# Patient Record
Sex: Male | Born: 1955 | Race: White | Hispanic: No | Marital: Single | Smoking: Never smoker
Health system: Southern US, Community
[De-identification: ages and names within clinical notes are randomized; demographics above are authoritative.]

## PROBLEM LIST (undated history)

## (undated) DIAGNOSIS — I10 Essential (primary) hypertension: Secondary | ICD-10-CM

## (undated) DIAGNOSIS — J45909 Unspecified asthma, uncomplicated: Secondary | ICD-10-CM

## (undated) DIAGNOSIS — M549 Dorsalgia, unspecified: Secondary | ICD-10-CM

## (undated) DIAGNOSIS — M1990 Unspecified osteoarthritis, unspecified site: Secondary | ICD-10-CM

## (undated) HISTORY — PX: HERNIA REPAIR: SHX51

## (undated) HISTORY — PX: COLONOSCOPY: SHX174

---

## 2006-07-24 ENCOUNTER — Emergency Department: Payer: Self-pay | Admitting: Emergency Medicine

## 2006-07-24 ENCOUNTER — Other Ambulatory Visit: Payer: Self-pay

## 2013-02-12 ENCOUNTER — Ambulatory Visit: Payer: Self-pay

## 2013-03-05 ENCOUNTER — Ambulatory Visit: Payer: Self-pay

## 2013-03-23 ENCOUNTER — Ambulatory Visit: Payer: Self-pay

## 2013-08-24 DIAGNOSIS — M48061 Spinal stenosis, lumbar region without neurogenic claudication: Secondary | ICD-10-CM | POA: Insufficient documentation

## 2013-08-24 DIAGNOSIS — M545 Low back pain, unspecified: Secondary | ICD-10-CM | POA: Insufficient documentation

## 2013-09-11 HISTORY — PX: BACK SURGERY: SHX140

## 2014-02-08 ENCOUNTER — Ambulatory Visit: Payer: Self-pay

## 2014-06-22 ENCOUNTER — Other Ambulatory Visit: Payer: Self-pay | Admitting: Neurosurgery

## 2014-06-22 DIAGNOSIS — M5416 Radiculopathy, lumbar region: Secondary | ICD-10-CM

## 2014-07-04 ENCOUNTER — Ambulatory Visit
Admission: RE | Admit: 2014-07-04 | Discharge: 2014-07-04 | Disposition: A | Discharge status: Home / Self Care | Payer: BC Managed Care – PPO | Source: Ambulatory Visit | Attending: Neurosurgery | Admitting: Neurosurgery

## 2014-07-04 VITALS — BP 136/75 | HR 70

## 2014-07-04 DIAGNOSIS — F419 Anxiety disorder, unspecified: Secondary | ICD-10-CM | POA: Insufficient documentation

## 2014-07-04 DIAGNOSIS — M5416 Radiculopathy, lumbar region: Secondary | ICD-10-CM

## 2014-07-04 DIAGNOSIS — I10 Essential (primary) hypertension: Secondary | ICD-10-CM | POA: Insufficient documentation

## 2014-07-04 DIAGNOSIS — E782 Mixed hyperlipidemia: Secondary | ICD-10-CM | POA: Insufficient documentation

## 2014-07-04 MED ORDER — DIAZEPAM 5 MG PO TABS
10.0000 mg | ORAL_TABLET | Freq: Once | ORAL | Status: AC
  Administered 2014-07-04: 10 mg via ORAL

## 2014-07-04 MED ORDER — IOHEXOL 180 MG/ML  SOLN
15.0000 mL | Freq: Once | INTRAMUSCULAR | Status: AC | PRN
  Administered 2014-07-04: 15 mL via INTRATHECAL

## 2014-07-04 NOTE — Discharge Instructions (Signed)
Myelogram Discharge Instructions  1. Go home and rest quietly for the next 24 hours.  It is important to lie flat for the next 24 hours.  Get up only to go to the restroom.  You may lie in the bed or on a couch on your back, your stomach, your left side or your right side.  You may have one pillow under your head.  You may have pillows between your knees while you are on your side or under your knees while you are on your back.  2. DO NOT drive today.  Recline the seat as far back as it will go, while still wearing your seat belt, on the way home.  3. You may get up to go to the bathroom as needed.  You may sit up for 10 minutes to eat.  You may resume your normal diet and medications unless otherwise indicated.  Drink lots of extra fluids today and tomorrow.  4. The incidence of headache, nausea, or vomiting is about 5% (one in 20 patients).  If you develop a headache, lie flat and drink plenty of fluids until the headache goes away.  Caffeinated beverages may be helpful.  If you develop severe nausea and vomiting or a headache that does not go away with flat bed rest, call [phone].  5. You may resume normal activities after your 24 hours of bed rest is over; however, do not exert yourself strongly or do any heavy lifting tomorrow. If when you get up you have a headache when standing, go back to bed and force fluids for another 24 hours.  6. Call your physician for a follow-up appointment.  The results of your myelogram will be sent directly to your physician by the following day.  7. If you have any questions or if complications develop after you arrive home, please call [phone].  Discharge instructions have been explained to the patient.  The patient, or the person responsible for the patient, fully understands these instructions.      May resume Duloxetine on Aug. 25, 2015, after 1:00 pm.

## 2014-07-04 NOTE — Progress Notes (Signed)
Patient states his last dose of duloxetine was two mornings ago.  jkl

## 2014-08-01 ENCOUNTER — Other Ambulatory Visit: Payer: Self-pay | Admitting: Neurosurgery

## 2014-08-09 ENCOUNTER — Encounter (HOSPITAL_COMMUNITY): Payer: Self-pay

## 2014-08-09 ENCOUNTER — Ambulatory Visit (HOSPITAL_COMMUNITY)
Admission: RE | Admit: 2014-08-09 | Discharge: 2014-08-09 | Disposition: A | Discharge status: Home / Self Care | Payer: BC Managed Care – PPO | Source: Ambulatory Visit | Attending: Neurosurgery | Admitting: Neurosurgery

## 2014-08-09 ENCOUNTER — Encounter (HOSPITAL_COMMUNITY)
Admission: RE | Admit: 2014-08-09 | Discharge: 2014-08-09 | Disposition: A | Discharge status: Home / Self Care | Payer: BC Managed Care – PPO | Source: Ambulatory Visit | Attending: Neurosurgery | Admitting: Neurosurgery

## 2014-08-09 ENCOUNTER — Ambulatory Visit (HOSPITAL_COMMUNITY): Admission: RE | Admit: 2014-08-09 | Payer: BC Managed Care – PPO | Source: Ambulatory Visit

## 2014-08-09 DIAGNOSIS — Z01818 Encounter for other preprocedural examination: Secondary | ICD-10-CM | POA: Diagnosis not present

## 2014-08-09 DIAGNOSIS — I10 Essential (primary) hypertension: Secondary | ICD-10-CM | POA: Insufficient documentation

## 2014-08-09 HISTORY — DX: Unspecified asthma, uncomplicated: J45.909

## 2014-08-09 HISTORY — DX: Dorsalgia, unspecified: M54.9

## 2014-08-09 HISTORY — DX: Essential (primary) hypertension: I10

## 2014-08-09 HISTORY — DX: Unspecified osteoarthritis, unspecified site: M19.90

## 2014-08-09 LAB — BASIC METABOLIC PANEL
Anion gap: 13 (ref 5–15)
BUN: 16 mg/dL (ref 6–23)
CHLORIDE: 100 meq/L (ref 96–112)
CO2: 25 mEq/L (ref 19–32)
Calcium: 9.4 mg/dL (ref 8.4–10.5)
Creatinine, Ser: 0.88 mg/dL (ref 0.50–1.35)
GFR calc Af Amer: >90 mL/min (ref >90)
GLUCOSE: 105 mg/dL — AB (ref 70–99)
POTASSIUM: 4.5 meq/L (ref 3.7–5.3)
SODIUM: 138 meq/L (ref 137–147)

## 2014-08-09 LAB — CBC
HCT: 49 % (ref 39.0–52.0)
HEMOGLOBIN: 17.3 g/dL — AB (ref 13.0–17.0)
MCH: 32 pg (ref 26.0–34.0)
MCHC: 35.3 g/dL (ref 30.0–36.0)
MCV: 90.6 fL (ref 78.0–100.0)
Platelets: 216 10*3/uL (ref 150–400)
RBC: 5.41 MIL/uL (ref 4.22–5.81)
RDW: 13.5 % (ref 11.5–15.5)
WBC: 11.5 10*3/uL — AB (ref 4.0–10.5)

## 2014-08-09 LAB — SURGICAL PCR SCREEN
MRSA, PCR: NEGATIVE
STAPHYLOCOCCUS AUREUS: NEGATIVE

## 2014-08-09 LAB — ABO/RH: ABO/RH(D): A POS

## 2014-08-09 LAB — TYPE AND SCREEN
ABO/RH(D): A POS
ANTIBODY SCREEN: NEGATIVE

## 2014-08-09 NOTE — Progress Notes (Signed)
Patient informed Nurse that he had [facility] within [facility]. Release signed. Will request records. Patient denied having a cardiac cath or sleep study. Patient also denied having any acute cardiac or pulmonary issues.

## 2014-08-09 NOTE — Pre-Procedure Instructions (Signed)
Christopher Hester  08/09/2014   Your procedure is scheduled on:  Wednesday August 17, 2014 at 8:30 AM.  [facility] 6:30 AM.  Call this number if you have problems the morning of surgery: [phone]   Remember:   Do not eat food or drink liquids after midnight.   Take these medicines the morning of surgery with A SIP OF WATER: Acetaminophen (Tylenol), Albuterol inhaler, Qvar inhaler,and Oxycodone   Do not wear jewelry.  Do not wear lotions, powders, or cologne.   Men may shave face and neck.  Do not bring valuables to the hospital.  [facility] is not responsible for any belongings or valuables.               Contacts, dentures or bridgework may not be worn into surgery.  Leave suitcase in the car. After surgery it may be brought to your room.  For patients admitted to the hospital, discharge time is determined by your treatment team.               Patients discharged the day of surgery will not be allowed to drive home.  Name and phone number of your driver: Family/Friend  Special Instructions: Shower using CHG soap the night before and the morning of your surgery   Please read over the following fact sheets that you were given: Pain Booklet, Coughing and Deep Breathing, Blood Transfusion Information, MRSA Information and Surgical Site Infection Prevention

## 2014-08-16 MED ORDER — CEFAZOLIN SODIUM-DEXTROSE 2-3 GM-% IV SOLR
2.0000 g | INTRAVENOUS | Status: AC
  Administered 2014-08-17 (×3): 2 g via INTRAVENOUS
  Filled 2014-08-16: qty 50

## 2014-08-17 ENCOUNTER — Inpatient Hospital Stay (HOSPITAL_COMMUNITY): Payer: BC Managed Care – PPO

## 2014-08-17 ENCOUNTER — Inpatient Hospital Stay (HOSPITAL_COMMUNITY): Payer: BC Managed Care – PPO | Admitting: Certified Registered Nurse Anesthetist

## 2014-08-17 ENCOUNTER — Encounter (HOSPITAL_COMMUNITY): Payer: BC Managed Care – PPO | Admitting: Certified Registered Nurse Anesthetist

## 2014-08-17 ENCOUNTER — Encounter (HOSPITAL_COMMUNITY): Payer: Self-pay | Admitting: *Deleted

## 2014-08-17 ENCOUNTER — Inpatient Hospital Stay (HOSPITAL_COMMUNITY)
Admission: RE | Admit: 2014-08-17 | Discharge: 2014-08-23 | DRG: 460 | Disposition: A | Discharge status: Skilled Nursing Facility | Payer: BC Managed Care – PPO | Source: Ambulatory Visit | Attending: Neurosurgery | Admitting: Neurosurgery

## 2014-08-17 ENCOUNTER — Encounter (HOSPITAL_COMMUNITY)
Admission: RE | Disposition: A | Discharge status: Skilled Nursing Facility | Payer: BC Managed Care – PPO | Source: Ambulatory Visit | Attending: Neurosurgery

## 2014-08-17 DIAGNOSIS — M129 Arthropathy, unspecified: Secondary | ICD-10-CM | POA: Diagnosis present

## 2014-08-17 DIAGNOSIS — M5489 Other dorsalgia: Secondary | ICD-10-CM | POA: Diagnosis present

## 2014-08-17 DIAGNOSIS — M5137 Other intervertebral disc degeneration, lumbosacral region: Secondary | ICD-10-CM | POA: Diagnosis present

## 2014-08-17 DIAGNOSIS — G8929 Other chronic pain: Secondary | ICD-10-CM | POA: Diagnosis present

## 2014-08-17 DIAGNOSIS — M4806 Spinal stenosis, lumbar region: Secondary | ICD-10-CM | POA: Diagnosis present

## 2014-08-17 DIAGNOSIS — M5416 Radiculopathy, lumbar region: Secondary | ICD-10-CM | POA: Diagnosis present

## 2014-08-17 DIAGNOSIS — R339 Retention of urine, unspecified: Secondary | ICD-10-CM | POA: Diagnosis present

## 2014-08-17 DIAGNOSIS — M4316 Spondylolisthesis, lumbar region: Secondary | ICD-10-CM

## 2014-08-17 DIAGNOSIS — M1990 Unspecified osteoarthritis, unspecified site: Secondary | ICD-10-CM | POA: Diagnosis present

## 2014-08-17 DIAGNOSIS — M48062 Spinal stenosis, lumbar region with neurogenic claudication: Secondary | ICD-10-CM | POA: Diagnosis present

## 2014-08-17 DIAGNOSIS — I10 Essential (primary) hypertension: Secondary | ICD-10-CM | POA: Diagnosis present

## 2014-08-17 DIAGNOSIS — J45909 Unspecified asthma, uncomplicated: Secondary | ICD-10-CM | POA: Diagnosis present

## 2014-08-17 HISTORY — PX: POSTERIOR LUMBAR FUSION 4 LEVEL: SHX6037

## 2014-08-17 SURGERY — POSTERIOR LUMBAR FUSION 4 LEVEL
Anesthesia: General | Site: Back

## 2014-08-17 MED ORDER — ALUM & MAG HYDROXIDE-SIMETH 200-200-20 MG/5ML PO SUSP: 30.0000 mL | Freq: Four times a day (QID) | ORAL | Status: DC | PRN

## 2014-08-17 MED ORDER — HYDROMORPHONE HCL 1 MG/ML IJ SOLN
INTRAMUSCULAR | Status: AC
  Filled 2014-08-17: qty 1

## 2014-08-17 MED ORDER — LACTATED RINGERS IV SOLN
INTRAVENOUS | Status: DC
  Administered 2014-08-17 – 2014-08-19 (×4): via INTRAVENOUS

## 2014-08-17 MED ORDER — SODIUM CHLORIDE 0.9 % IJ SOLN
INTRAMUSCULAR | Status: AC
  Filled 2014-08-17: qty 10

## 2014-08-17 MED ORDER — FENTANYL CITRATE 0.05 MG/ML IJ SOLN
INTRAMUSCULAR | Status: AC
  Filled 2014-08-17: qty 5

## 2014-08-17 MED ORDER — ACETAMINOPHEN 650 MG RE SUPP: 650.0000 mg | RECTAL | Status: DC | PRN

## 2014-08-17 MED ORDER — ACETAMINOPHEN 325 MG PO TABS
650.0000 mg | ORAL_TABLET | ORAL | Status: DC | PRN
  Administered 2014-08-18 – 2014-08-21 (×3): 650 mg via ORAL
  Filled 2014-08-17 (×3): qty 2

## 2014-08-17 MED ORDER — FENTANYL CITRATE 0.05 MG/ML IJ SOLN
INTRAMUSCULAR | Status: DC | PRN
  Administered 2014-08-17 (×9): 50 ug via INTRAVENOUS
  Administered 2014-08-17: 100 ug via INTRAVENOUS
  Administered 2014-08-17 (×2): 50 ug via INTRAVENOUS

## 2014-08-17 MED ORDER — SCOPOLAMINE 1 MG/3DAYS TD PT72
MEDICATED_PATCH | TRANSDERMAL | Status: AC
  Filled 2014-08-17: qty 1

## 2014-08-17 MED ORDER — HYDROCODONE-ACETAMINOPHEN 5-325 MG PO TABS: 1.0000 | ORAL_TABLET | ORAL | Status: DC | PRN

## 2014-08-17 MED ORDER — BUPIVACAINE-EPINEPHRINE (PF) 0.5% -1:200000 IJ SOLN
INTRAMUSCULAR | Status: DC | PRN
  Administered 2014-08-17: 20 mL

## 2014-08-17 MED ORDER — CEFAZOLIN SODIUM-DEXTROSE 2-3 GM-% IV SOLR
2.0000 g | Freq: Three times a day (TID) | INTRAVENOUS | Status: AC
  Administered 2014-08-17 – 2014-08-18 (×2): 2 g via INTRAVENOUS
  Filled 2014-08-17 (×2): qty 50

## 2014-08-17 MED ORDER — SCOPOLAMINE 1 MG/3DAYS TD PT72
1.0000 | MEDICATED_PATCH | TRANSDERMAL | Status: DC
  Administered 2014-08-17: 1 via TRANSDERMAL

## 2014-08-17 MED ORDER — SODIUM CHLORIDE 0.9 % IV SOLN
INTRAVENOUS | Status: DC | PRN
  Administered 2014-08-17: 14:00:00 via INTRAVENOUS

## 2014-08-17 MED ORDER — OXYCODONE HCL 5 MG PO TABS: 5.0000 mg | ORAL_TABLET | Freq: Once | ORAL | Status: DC | PRN

## 2014-08-17 MED ORDER — PHENYLEPHRINE HCL 10 MG/ML IJ SOLN
INTRAMUSCULAR | Status: DC | PRN
  Administered 2014-08-17 (×2): 80 ug via INTRAVENOUS

## 2014-08-17 MED ORDER — DEXAMETHASONE SODIUM PHOSPHATE 4 MG/ML IJ SOLN
INTRAMUSCULAR | Status: AC
  Filled 2014-08-17: qty 1

## 2014-08-17 MED ORDER — FLUTICASONE PROPIONATE HFA 44 MCG/ACT IN AERO
1.0000 | INHALATION_SPRAY | Freq: Two times a day (BID) | RESPIRATORY_TRACT | Status: DC
  Administered 2014-08-19 – 2014-08-23 (×8): 1 via RESPIRATORY_TRACT
  Filled 2014-08-17 (×2): qty 10.6

## 2014-08-17 MED ORDER — DOCUSATE SODIUM 100 MG PO CAPS
100.0000 mg | ORAL_CAPSULE | Freq: Two times a day (BID) | ORAL | Status: DC
  Administered 2014-08-17 – 2014-08-23 (×12): 100 mg via ORAL
  Filled 2014-08-17 (×12): qty 1

## 2014-08-17 MED ORDER — ONDANSETRON HCL 4 MG/2ML IJ SOLN
4.0000 mg | INTRAMUSCULAR | Status: DC | PRN
  Filled 2014-08-17: qty 2

## 2014-08-17 MED ORDER — MIDAZOLAM HCL 2 MG/2ML IJ SOLN
INTRAMUSCULAR | Status: AC
  Filled 2014-08-17: qty 2

## 2014-08-17 MED ORDER — PROPOFOL 10 MG/ML IV BOLUS
INTRAVENOUS | Status: AC
  Filled 2014-08-17: qty 20

## 2014-08-17 MED ORDER — ALBUTEROL SULFATE (2.5 MG/3ML) 0.083% IN NEBU: 2.5000 mL | INHALATION_SOLUTION | Freq: Four times a day (QID) | RESPIRATORY_TRACT | Status: DC | PRN

## 2014-08-17 MED ORDER — NEOSTIGMINE METHYLSULFATE 10 MG/10ML IV SOLN
INTRAVENOUS | Status: DC | PRN
  Administered 2014-08-17: 4 mg via INTRAVENOUS

## 2014-08-17 MED ORDER — LACTATED RINGERS IV SOLN
INTRAVENOUS | Status: DC | PRN
  Administered 2014-08-17 (×3): via INTRAVENOUS

## 2014-08-17 MED ORDER — PROMETHAZINE HCL 25 MG/ML IJ SOLN: 6.2500 mg | INTRAMUSCULAR | Status: DC | PRN

## 2014-08-17 MED ORDER — DIAZEPAM 5 MG PO TABS
5.0000 mg | ORAL_TABLET | Freq: Four times a day (QID) | ORAL | Status: DC | PRN
  Administered 2014-08-18 – 2014-08-23 (×11): 5 mg via ORAL
  Filled 2014-08-17 (×12): qty 1

## 2014-08-17 MED ORDER — DEXAMETHASONE SODIUM PHOSPHATE 4 MG/ML IJ SOLN
INTRAMUSCULAR | Status: DC | PRN
  Administered 2014-08-17: 4 mg via INTRAVENOUS

## 2014-08-17 MED ORDER — MIDAZOLAM HCL 5 MG/5ML IJ SOLN
INTRAMUSCULAR | Status: DC | PRN
  Administered 2014-08-17 (×2): 2 mg via INTRAVENOUS

## 2014-08-17 MED ORDER — NEOSTIGMINE METHYLSULFATE 10 MG/10ML IV SOLN
INTRAVENOUS | Status: AC
  Filled 2014-08-17: qty 1

## 2014-08-17 MED ORDER — MENTHOL 3 MG MT LOZG
1.0000 | LOZENGE | OROMUCOSAL | Status: DC | PRN
Start: 2014-08-17 — End: 2014-08-23

## 2014-08-17 MED ORDER — LIDOCAINE HCL (CARDIAC) 20 MG/ML IV SOLN
INTRAVENOUS | Status: AC
  Filled 2014-08-17: qty 5

## 2014-08-17 MED ORDER — LIDOCAINE HCL (CARDIAC) 20 MG/ML IV SOLN
INTRAVENOUS | Status: DC | PRN
  Administered 2014-08-17: 100 mg via INTRAVENOUS

## 2014-08-17 MED ORDER — EPHEDRINE SULFATE 50 MG/ML IJ SOLN
INTRAMUSCULAR | Status: AC
  Filled 2014-08-17: qty 1

## 2014-08-17 MED ORDER — PHENYLEPHRINE HCL 10 MG/ML IJ SOLN
10.0000 mg | INTRAVENOUS | Status: DC | PRN
  Administered 2014-08-17: 15 ug/min via INTRAVENOUS

## 2014-08-17 MED ORDER — ONDANSETRON HCL 4 MG/2ML IJ SOLN
INTRAMUSCULAR | Status: AC
  Filled 2014-08-17: qty 2

## 2014-08-17 MED ORDER — ROCURONIUM BROMIDE 50 MG/5ML IV SOLN
INTRAVENOUS | Status: AC
  Filled 2014-08-17: qty 2

## 2014-08-17 MED ORDER — 0.9 % SODIUM CHLORIDE (POUR BTL) OPTIME
TOPICAL | Status: DC | PRN
  Administered 2014-08-17 (×2): 1000 mL

## 2014-08-17 MED ORDER — BUPIVACAINE LIPOSOME 1.3 % IJ SUSP
20.0000 mL | INTRAMUSCULAR | Status: DC
  Filled 2014-08-17: qty 20

## 2014-08-17 MED ORDER — PHENYLEPHRINE 40 MCG/ML (10ML) SYRINGE FOR IV PUSH (FOR BLOOD PRESSURE SUPPORT)
PREFILLED_SYRINGE | INTRAVENOUS | Status: AC
  Filled 2014-08-17: qty 10

## 2014-08-17 MED ORDER — BUPIVACAINE LIPOSOME 1.3 % IJ SUSP
INTRAMUSCULAR | Status: DC | PRN
  Administered 2014-08-17: 20 mL

## 2014-08-17 MED ORDER — SODIUM CHLORIDE 0.9 % IR SOLN
Status: DC | PRN
  Administered 2014-08-17 (×2)

## 2014-08-17 MED ORDER — HYDROMORPHONE HCL 1 MG/ML IJ SOLN
0.2500 mg | INTRAMUSCULAR | Status: DC | PRN
  Administered 2014-08-17 (×4): 0.5 mg via INTRAVENOUS

## 2014-08-17 MED ORDER — PHENOL 1.4 % MT LIQD
1.0000 | OROMUCOSAL | Status: DC | PRN
Start: 2014-08-17 — End: 2014-08-23

## 2014-08-17 MED ORDER — SUCCINYLCHOLINE CHLORIDE 20 MG/ML IJ SOLN
INTRAMUSCULAR | Status: DC | PRN
  Administered 2014-08-17: 110 mg via INTRAVENOUS

## 2014-08-17 MED ORDER — BACITRACIN ZINC 500 UNIT/GM EX OINT
TOPICAL_OINTMENT | CUTANEOUS | Status: DC | PRN
  Administered 2014-08-17: 1 via TOPICAL

## 2014-08-17 MED ORDER — ROCURONIUM BROMIDE 50 MG/5ML IV SOLN
INTRAVENOUS | Status: AC
  Filled 2014-08-17: qty 1

## 2014-08-17 MED ORDER — GLYCOPYRROLATE 0.2 MG/ML IJ SOLN
INTRAMUSCULAR | Status: DC | PRN
  Administered 2014-08-17: 0.6 mg via INTRAVENOUS

## 2014-08-17 MED ORDER — ROCURONIUM BROMIDE 100 MG/10ML IV SOLN
INTRAVENOUS | Status: DC | PRN
  Administered 2014-08-17 (×3): 10 mg via INTRAVENOUS
  Administered 2014-08-17: 50 mg via INTRAVENOUS
  Administered 2014-08-17 (×9): 10 mg via INTRAVENOUS

## 2014-08-17 MED ORDER — OXYCODONE HCL 5 MG/5ML PO SOLN: 5.0000 mg | Freq: Once | ORAL | Status: DC | PRN

## 2014-08-17 MED ORDER — GLYCOPYRROLATE 0.2 MG/ML IJ SOLN
INTRAMUSCULAR | Status: AC
  Filled 2014-08-17: qty 3

## 2014-08-17 MED ORDER — CEFAZOLIN SODIUM-DEXTROSE 2-3 GM-% IV SOLR
INTRAVENOUS | Status: AC
  Filled 2014-08-17: qty 50

## 2014-08-17 MED ORDER — PROPOFOL 10 MG/ML IV BOLUS
INTRAVENOUS | Status: DC | PRN
  Administered 2014-08-17: 170 mg via INTRAVENOUS

## 2014-08-17 MED ORDER — THROMBIN 20000 UNITS EX SOLR
CUTANEOUS | Status: DC | PRN
  Administered 2014-08-17 (×2): via TOPICAL

## 2014-08-17 MED ORDER — DULOXETINE HCL 60 MG PO CPEP
60.0000 mg | ORAL_CAPSULE | Freq: Every day | ORAL | Status: DC
  Administered 2014-08-17 – 2014-08-23 (×7): 60 mg via ORAL
  Filled 2014-08-17 (×7): qty 1

## 2014-08-17 MED ORDER — ONDANSETRON HCL 4 MG/2ML IJ SOLN
INTRAMUSCULAR | Status: DC | PRN
  Administered 2014-08-17: 4 mg via INTRAVENOUS

## 2014-08-17 MED ORDER — ALBUMIN HUMAN 5 % IV SOLN
INTRAVENOUS | Status: DC | PRN
  Administered 2014-08-17: 16:00:00 via INTRAVENOUS

## 2014-08-17 MED ORDER — AMLODIPINE BESYLATE 2.5 MG PO TABS
2.5000 mg | ORAL_TABLET | Freq: Every day | ORAL | Status: DC
  Administered 2014-08-17 – 2014-08-23 (×6): 2.5 mg via ORAL
  Filled 2014-08-17 (×8): qty 1

## 2014-08-17 MED ORDER — MORPHINE SULFATE 2 MG/ML IJ SOLN
1.0000 mg | INTRAMUSCULAR | Status: DC | PRN
  Administered 2014-08-17 – 2014-08-18 (×7): 4 mg via INTRAVENOUS
  Administered 2014-08-19 (×3): 2 mg via INTRAVENOUS
  Administered 2014-08-19 (×2): 4 mg via INTRAVENOUS
  Administered 2014-08-20 – 2014-08-22 (×11): 2 mg via INTRAVENOUS
  Filled 2014-08-17 (×2): qty 1
  Filled 2014-08-17 (×2): qty 2
  Filled 2014-08-17: qty 1
  Filled 2014-08-17: qty 2
  Filled 2014-08-17 (×3): qty 1
  Filled 2014-08-17: qty 2
  Filled 2014-08-17 (×2): qty 1
  Filled 2014-08-17 (×2): qty 2
  Filled 2014-08-17 (×3): qty 1
  Filled 2014-08-17 (×2): qty 2
  Filled 2014-08-17 (×2): qty 1
  Filled 2014-08-17: qty 2
  Filled 2014-08-17 (×2): qty 1

## 2014-08-17 MED ORDER — OXYCODONE-ACETAMINOPHEN 5-325 MG PO TABS
1.0000 | ORAL_TABLET | ORAL | Status: DC | PRN
  Administered 2014-08-17 – 2014-08-23 (×20): 2 via ORAL
  Filled 2014-08-17 (×22): qty 2

## 2014-08-17 MED ORDER — LACTATED RINGERS IV SOLN
INTRAVENOUS | Status: DC | PRN
  Administered 2014-08-17 (×2): via INTRAVENOUS

## 2014-08-17 SURGICAL SUPPLY — 78 items
BAG DECANTER FOR FLEXI CONT (MISCELLANEOUS) ×3 IMPLANT
BENZOIN TINCTURE PRP APPL 2/3 (GAUZE/BANDAGES/DRESSINGS) ×3 IMPLANT
BLADE CLIPPER SURG (BLADE) ×3 IMPLANT
BRUSH SCRUB EZ PLAIN DRY (MISCELLANEOUS) ×3 IMPLANT
BUR MATCHSTICK NEURO 3.0 LAGG (BURR) ×3 IMPLANT
BUR PRECISION FLUTE 6.0 (BURR) ×3 IMPLANT
CANISTER SUCT 3000ML (MISCELLANEOUS) ×3 IMPLANT
CAP REVERE LOCKING (Cap) ×30 IMPLANT
CLOSURE WOUND 1/2 X4 (GAUZE/BANDAGES/DRESSINGS) ×1
CONN CROSSLINK REV 6.35 48-60 (Connector) ×3 IMPLANT
CONNECTOR CRSLNK REV6.35 48-60 (Connector) ×1 IMPLANT
CONT SPEC 4OZ CLIKSEAL STRL BL (MISCELLANEOUS) ×6 IMPLANT
COVER BACK TABLE 24X17X13 BIG (DRAPES) IMPLANT
DRAPE C-ARM 42X72 X-RAY (DRAPES) ×6 IMPLANT
DRAPE LAPAROTOMY 100X72X124 (DRAPES) ×3 IMPLANT
DRAPE POUCH INSTRU U-SHP 10X18 (DRAPES) ×3 IMPLANT
DRAPE PROXIMA HALF (DRAPES) ×3 IMPLANT
DRAPE SURG 17X23 STRL (DRAPES) ×12 IMPLANT
ELECT BLADE 4.0 EZ CLEAN MEGAD (MISCELLANEOUS) ×3
ELECT REM PT RETURN 9FT ADLT (ELECTROSURGICAL) ×3
ELECTRODE BLDE 4.0 EZ CLN MEGD (MISCELLANEOUS) ×1 IMPLANT
ELECTRODE REM PT RTRN 9FT ADLT (ELECTROSURGICAL) ×1 IMPLANT
EVACUATOR 1/8 PVC DRAIN (DRAIN) ×3 IMPLANT
GAUZE SPONGE 4X4 12PLY STRL (GAUZE/BANDAGES/DRESSINGS) ×3 IMPLANT
GAUZE SPONGE 4X4 16PLY XRAY LF (GAUZE/BANDAGES/DRESSINGS) ×6 IMPLANT
GLOVE BIO SURGEON STRL SZ8.5 (GLOVE) ×6 IMPLANT
GLOVE BIOGEL PI IND STRL 8 (GLOVE) ×5 IMPLANT
GLOVE BIOGEL PI INDICATOR 8 (GLOVE) ×10
GLOVE ECLIPSE 7.5 STRL STRAW (GLOVE) ×21 IMPLANT
GLOVE EXAM NITRILE LRG STRL (GLOVE) IMPLANT
GLOVE EXAM NITRILE MD LF STRL (GLOVE) IMPLANT
GLOVE EXAM NITRILE XL STR (GLOVE) IMPLANT
GLOVE EXAM NITRILE XS STR PU (GLOVE) IMPLANT
GLOVE SS BIOGEL STRL SZ 8 (GLOVE) ×2 IMPLANT
GLOVE SUPERSENSE BIOGEL SZ 8 (GLOVE) ×4
GOWN STRL REUS W/ TWL LRG LVL3 (GOWN DISPOSABLE) IMPLANT
GOWN STRL REUS W/ TWL XL LVL3 (GOWN DISPOSABLE) ×3 IMPLANT
GOWN STRL REUS W/TWL 2XL LVL3 (GOWN DISPOSABLE) ×9 IMPLANT
GOWN STRL REUS W/TWL LRG LVL3 (GOWN DISPOSABLE)
GOWN STRL REUS W/TWL XL LVL3 (GOWN DISPOSABLE) ×6
KIT BASIN OR (CUSTOM PROCEDURE TRAY) ×3 IMPLANT
KIT INFUSE MEDIUM (Orthopedic Implant) ×3 IMPLANT
KIT ROOM TURNOVER OR (KITS) ×3 IMPLANT
MILL MEDIUM DISP (BLADE) ×3 IMPLANT
NEEDLE HYPO 21X1.5 SAFETY (NEEDLE) ×3 IMPLANT
NEEDLE HYPO 22GX1.5 SAFETY (NEEDLE) ×3 IMPLANT
NS IRRIG 1000ML POUR BTL (IV SOLUTION) ×3 IMPLANT
PACK LAMINECTOMY NEURO (CUSTOM PROCEDURE TRAY) ×3 IMPLANT
PAD ARMBOARD 7.5X6 YLW CONV (MISCELLANEOUS) ×9 IMPLANT
PATTIES SURGICAL .5 X.5 (GAUZE/BANDAGES/DRESSINGS) ×3 IMPLANT
PATTIES SURGICAL .5 X1 (DISPOSABLE) IMPLANT
PATTIES SURGICAL 1X1 (DISPOSABLE) ×3 IMPLANT
ROD REVERE 6.35 CURVED 125MM (Rod) ×6 IMPLANT
SCREW 7.5X45MM (Screw) ×6 IMPLANT
SCREW 7.5X50MM (Screw) ×24 IMPLANT
SPACER SUSTAIN O 10X10X26 (Screw) ×6 IMPLANT
SPACER SUSTAIN O 10X26 11MM (Peek) ×6 IMPLANT
SPACER SUSTAIN O 10X26 12MM (Spacer) ×6 IMPLANT
SPACER SUSTAIN O 10X26 9MM (Spacer) ×6 IMPLANT
SPONGE LAP 4X18 X RAY DECT (DISPOSABLE) IMPLANT
SPONGE NEURO XRAY DETECT 1X3 (DISPOSABLE) IMPLANT
SPONGE SURGIFOAM ABS GEL 100 (HEMOSTASIS) ×6 IMPLANT
STRIP BIOACTIVE 10CC 25X100X4 (Miscellaneous) ×3 IMPLANT
STRIP BIOACTIVE 20CC 25X100X8 (Miscellaneous) ×3 IMPLANT
STRIP BIOACTIVE 5CC 25X50X4MM (Miscellaneous) ×6 IMPLANT
STRIP CLOSURE SKIN 1/2X4 (GAUZE/BANDAGES/DRESSINGS) ×2 IMPLANT
SUT BONE WAX W31G (SUTURE) ×3 IMPLANT
SUT VIC AB 1 CT1 18XBRD ANBCTR (SUTURE) ×3 IMPLANT
SUT VIC AB 1 CT1 8-18 (SUTURE) ×6
SUT VIC AB 2-0 CP2 18 (SUTURE) ×9 IMPLANT
SYR 20CC LL (SYRINGE) ×3 IMPLANT
SYR 20ML ECCENTRIC (SYRINGE) ×3 IMPLANT
TAPE CLOTH SURG 4X10 WHT LF (GAUZE/BANDAGES/DRESSINGS) ×3 IMPLANT
TOWEL OR 17X24 6PK STRL BLUE (TOWEL DISPOSABLE) ×3 IMPLANT
TOWEL OR 17X26 10 PK STRL BLUE (TOWEL DISPOSABLE) ×3 IMPLANT
TRAY FOLEY CATH 14FRSI W/METER (CATHETERS) IMPLANT
TRAY FOLEY CATH 16FRSI W/METER (SET/KITS/TRAYS/PACK) ×3 IMPLANT
WATER STERILE IRR 1000ML POUR (IV SOLUTION) ×3 IMPLANT

## 2014-08-17 NOTE — Anesthesia Procedure Notes (Signed)
Procedure Name: Intubation Date/Time: 08/17/2014 8:31 AM Performed by: Orvilla FusATO, Mikisha Roseland A Pre-anesthesia Checklist: Patient identified, Timeout performed, Emergency Drugs available, Suction available and Patient being monitored Patient Re-evaluated:Patient Re-evaluated prior to inductionOxygen Delivery Method: Circle system utilized Preoxygenation: Pre-oxygenation with 100% oxygen Intubation Type: IV induction Ventilation: Mask ventilation without difficulty and Oral airway inserted - appropriate to patient size Laryngoscope Size: Mac and 4 Grade View: Grade II Tube type: Oral Tube size: 7.5 mm Number of attempts: 1 Airway Equipment and Method: Stylet Placement Confirmation: ETT inserted through vocal cords under direct vision,  breath sounds checked- equal and bilateral and positive ETCO2 Secured at: 23 cm Tube secured with: Tape Dental Injury: Teeth and Oropharynx as per pre-operative assessment

## 2014-08-17 NOTE — Anesthesia Postprocedure Evaluation (Signed)
Anesthesia Post Note  Patient: Christopher Hester  Procedure(s) Performed: Procedure(s) (LRB): Lumbar two-three, Lumbar three-four, Lumbar four-five, Lumbar five-Sacral one Laminectomy and  Posterior Lumbar Interbody Fusion  (N/A)  Anesthesia type: general  Patient location: PACU  Post pain: Pain level controlled  Post assessment: Patient's Cardiovascular Status Stable  Last Vitals:  Filed Vitals:   08/17/14 1717  BP:   Pulse:   Temp: 37.2 C  Resp:     Post vital signs: Reviewed and stable  Level of consciousness: sedated  Complications: No apparent anesthesia complications

## 2014-08-17 NOTE — Progress Notes (Signed)
Patient ID: Christopher Hester, male   DOB: 1956/10/05, 58 y.o.   MRN: [id number] Subjective:  The patient is very somnolent. He was just extubated. He is in no apparent distress. I spoke with his family.  Objective: Vital signs in last 24 hours: Temp:  [97.5 F (36.4 C)] 97.5 F (36.4 C) (10/07 0645) Pulse Rate:  [75] 75 (10/07 0645) Resp:  [16] 16 (10/07 0645) BP: (122)/(74) 122/74 mmHg (10/07 0645) SpO2:  [97 %] 97 % (10/07 0645)  Intake/Output from previous day:   Intake/Output this shift: Total I/O In: 3750 [I.V.:3100; Blood:400; IV Piggyback:250] Out: 1900 [Urine:300; Blood:1600]  Physical exam the patient is very somnolent.  Lab Results: No results found for this basename: WBC, HGB, HCT, PLT,  in the last 72 hours BMET No results found for this basename: NA, K, CL, CO2, GLUCOSE, BUN, CREATININE, CALCIUM,  in the last 72 hours  Studies/Results: Dg Lumbar Spine Complete  08/17/2014   CLINICAL DATA:  58 year old male undergoing lumbar spine surgery. Initial encounter.  EXAM: DG C-ARM 61-120 MIN; LUMBAR SPINE - COMPLETE 4+ VIEW  TECHNIQUE: Four intraoperative fluoroscopic views of the lumbar spine.  CONTRAST:  None  FLUOROSCOPY TIME:  0 min 37 seconds  COMPARISON:  CT lumbar myelogram 07/04/2014  FINDINGS: Same numbering system as on the comparison. These images demonstrate placement of transpedicular and interbody fusion hardware from the S1 level to the L2 level. Connecting rods not yet in place. Suspect laminectomies also at these levels.  IMPRESSION: L2 to S1 level fusion and decompression underway.   Electronically Signed   By: Augusto GambleLee  Hall M.D.   On: 08/17/2014 16:19   Dg Lumbar Spine 1 View  08/17/2014   CLINICAL DATA:  Posterior lumbar fusion for congenital spondylolisthesis.  EXAM: LUMBAR SPINE - 1 VIEW  COMPARISON:  CT scan of July 04, 2014.  FINDINGS: Single lateral intraoperative view of the lumbar spine was submitted for review. Multilevel degenerative disc disease is noted.  Surgical probe is directed at posterior portion of L5-S1 disc space. Surgical retractors are seen in the soft tissues posterior to L2 through L5.  IMPRESSION: Surgical localization of L5-S1 as described above.   Electronically Signed   By: Roque LiasJames  Green M.D.   On: 08/17/2014 16:15   Dg C-arm 1-60 Min  08/17/2014   CLINICAL DATA:  58 year old male undergoing lumbar spine surgery. Initial encounter.  EXAM: DG C-ARM 61-120 MIN; LUMBAR SPINE - COMPLETE 4+ VIEW  TECHNIQUE: Four intraoperative fluoroscopic views of the lumbar spine.  CONTRAST:  None  FLUOROSCOPY TIME:  0 min 37 seconds  COMPARISON:  CT lumbar myelogram 07/04/2014  FINDINGS: Same numbering system as on the comparison. These images demonstrate placement of transpedicular and interbody fusion hardware from the S1 level to the L2 level. Connecting rods not yet in place. Suspect laminectomies also at these levels.  IMPRESSION: L2 to S1 level fusion and decompression underway.   Electronically Signed   By: Augusto GambleLee  Hall M.D.   On: 08/17/2014 16:19    Assessment/Plan: The patient appears to be doing well.  LOS: 0 days     Randal Yepiz D 08/17/2014, 5:13 PM

## 2014-08-17 NOTE — H&P (Signed)
Subjective: The patient is a 58 year old white male who's had chronic back pain. He has failed extensive nonsurgical management. He was worked up with a lumbar MRI which demonstrated multilevel degenerative changes with stenosis most prominent at L2-3, L3-4, L4-5 and L5-S1. I discussed the various treatment options with him including surgery. He has weighed the risks, benefits, and alternatives surgery and decided proceed with a 4 level lumbar decompression, instrumentation, and fusion.   Past Medical History  Diagnosis Date  . Hypertension   . Asthma   . Back pain   . Arthritis     Past Surgical History  Procedure Laterality Date  . Back surgery  Nov 2014    [facility]  . Hernia repair      as a child; ingunial right    No Known Allergies  History  Substance Use Topics  . Smoking status: Never Smoker   . Smokeless tobacco: Not on file  . Alcohol Use: No    History reviewed. No pertinent family history. Prior to Admission medications   Medication Sig Start Date End Date Taking? Authorizing Provider  Acetaminophen 500 MG coapsule Take 500 mg by mouth every 6 (six) hours as needed for pain.    Yes Historical Provider, MD  albuterol (PROAIR HFA) 108 (90 BASE) MCG/ACT inhaler Inhale 1-2 puffs into the lungs every 6 (six) hours as needed for shortness of breath.    Yes Historical Provider, MD  amLODipine (NORVASC) 2.5 MG tablet Take 2.5 mg by mouth daily. Takes in afternoon   Yes Historical Provider, MD  beclomethasone (QVAR) 40 MCG/ACT inhaler Inhale 2 puffs into the lungs daily.    Yes Historical Provider, MD  DULoxetine (CYMBALTA) 60 MG capsule Take 60 mg by mouth daily. Takes in the afternoon 10/29/13  Yes Historical Provider, MD  methocarbamol (ROBAXIN) 750 MG tablet Take 750 mg by mouth every 6 (six) hours as needed for muscle spasms.  12/04/13  Yes Historical Provider, MD  oxyCODONE-acetaminophen (PERCOCET/ROXICET) 5-325 MG per tablet Take 1 tablet by mouth every 6 (six) hours as needed  for severe pain.   Yes Historical Provider, MD  senna-docusate (SENOKOT-S) 8.6-50 MG per tablet Take 1 tablet by mouth daily as needed (for constipation).  10/09/13 10/09/14  Historical Provider, MD     Review of Systems  Positive ROS: As above  All other systems have been reviewed and were otherwise negative with the exception of those mentioned in the HPI and as above.  Objective: Vital signs in last 24 hours: Temp:  [97.5 F (36.4 C)] 97.5 F (36.4 C) (10/07 0645) Pulse Rate:  [75] 75 (10/07 0645) Resp:  [16] 16 (10/07 0645) BP: (122)/(74) 122/74 mmHg (10/07 0645) SpO2:  [97 %] 97 % (10/07 0645)  General Appearance: Alert, cooperative, no distress, Head: Normocephalic, without obvious abnormality, atraumatic Eyes: PERRL, conjunctiva/corneas clear, EOM's intact,    Ears: Normal  Throat: Normal  Neck: Supple, symmetrical, trachea midline, no adenopathy; thyroid: No enlargement/tenderness/nodules; no carotid bruit or JVD Back: Symmetric, no curvature, ROM normal, no CVA tenderness Lungs: Clear to auscultation bilaterally, respirations unlabored Heart: Regular rate and rhythm, no murmur, rub or gallop Abdomen: Soft, non-tender,, no masses, no organomegaly Extremities: Extremities normal, atraumatic, no cyanosis or edema Pulses: 2+ and symmetric all extremities Skin: Skin color, texture, turgor normal, no rashes or lesions  NEUROLOGIC:   Mental status: alert and oriented, no aphasia, good attention span, Fund of knowledge/ memory ok Motor Exam - grossly normal Sensory Exam - grossly normal Reflexes:  Coordination - grossly normal Gait - grossly normal Balance - grossly normal Cranial Nerves: I: smell Not tested  II: visual acuity  OS: Normal  OD: Normal   II: visual fields Full to confrontation  II: pupils Equal, round, reactive to light  III,VII: ptosis None  III,IV,VI: extraocular muscles  Full ROM  V: mastication Normal  V: facial light touch sensation  Normal   V,VII: corneal reflex  Present  VII: facial muscle function - upper  Normal  VII: facial muscle function - lower Normal  VIII: hearing Not tested  IX: soft palate elevation  Normal  IX,X: gag reflex Present  XI: trapezius strength  5/5  XI: sternocleidomastoid strength 5/5  XI: neck flexion strength  5/5  XII: tongue strength  Normal    Data Review Lab Results  Component Value Date   WBC 11.5* 08/09/2014   HGB 17.3* 08/09/2014   HCT 49.0 08/09/2014   MCV 90.6 08/09/2014   PLT 216 08/09/2014   Lab Results  Component Value Date   NA 138 08/09/2014   K 4.5 08/09/2014   CL 100 08/09/2014   CO2 25 08/09/2014   BUN 16 08/09/2014   CREATININE 0.88 08/09/2014   GLUCOSE 105* 08/09/2014   No results found for this basename: INR, PROTIME    Assessment/Plan: L2-3, L3-4, L4-5, L5-S1 disc degeneration, spinal stenosis, lumbago, lumbar radiculopathy: I discussed situation with the patient. I have reviewed his imaging studies with them and pointed out the abnormalities. We have discussed the various treatment options including surgery. I have described the surgical treatment option of an L2-3, L3-4, L4-5 and L5-S1 decompression, instrumentation, and fusion. I have shown him surgical models. We have discussed the risks, benefits, alternatives, and likelihood of achieving our goals with surgery. I've answered all the patient's questions. He has decided proceed with surgery.   Christopher Hester D 08/17/2014 8:15 AM

## 2014-08-17 NOTE — Anesthesia Preprocedure Evaluation (Addendum)
Anesthesia Evaluation  Patient identified by MRN, date of birth, ID band Patient awake    Reviewed: Allergy & Precautions, H&P , NPO status , Patient's Chart, lab work & pertinent test results  History of Anesthesia Complications Negative for: history of anesthetic complications  Airway Mallampati: III TM Distance: >3 FB Neck ROM: Full    Dental  (+) Teeth Intact, Dental Advisory Given   Pulmonary asthma ,    Pulmonary exam normal       Cardiovascular hypertension, Pt. on medications     Neuro/Psych PSYCHIATRIC DISORDERS Anxiety negative neurological ROS     GI/Hepatic negative GI ROS, Neg liver ROS,   Endo/Other  negative endocrine ROS  Renal/GU negative Renal ROS     Musculoskeletal   Abdominal   Peds  Hematology   Anesthesia Other Findings   Reproductive/Obstetrics                          Anesthesia Physical Anesthesia Plan  ASA: II  Anesthesia Plan: General   Post-op Pain Management:    Induction: Intravenous  Airway Management Planned: Oral ETT  Additional Equipment:   Intra-op Plan:   Post-operative Plan: Extubation in OR  Informed Consent: I have reviewed the patients History and Physical, chart, labs and discussed the procedure including the risks, benefits and alternatives for the proposed anesthesia with the patient or authorized representative who has indicated his/her understanding and acceptance.   Dental advisory given  Plan Discussed with: CRNA, Anesthesiologist and Surgeon  Anesthesia Plan Comments:        Anesthesia Quick Evaluation

## 2014-08-17 NOTE — Op Note (Signed)
Brief history: The patient is a 58 year old white male who's had previous back surgery at another institution. He has had chronic back and leg pain. He has failed medical management and was worked up with a lumbar MRI and lumbar myelo CT. This demonstrated multilevel disc degeneration, facet arthropathy, spinal stenosis, foraminal stenosis, etc. I discussed the various treatment option with the patient including surgery. He has weighed the risks, benefits, and alternatives surgery and decided proceed with a lumbar decompression and fusion.  Preoperative diagnosis: L2-3, L3-4, L4-5, L5-S1 Degenerative disc disease, spinal stenosis compressing bilateral L2, L3, L4, L5 and S1 nerve roots; lumbago; lumbar radiculopathy  Postoperative diagnosis: The same  Procedure: L2, L3, L4 and L5 Laminotomy/foraminotomies to decompress the bilateral L2, L3, L4, L5 and S1 nerve roots(the work required to do this was in addition to the work required to do the posterior lumbar interbody fusion because of the patient's spinal stenosis, facet arthropathy. Etc. requiring a wide decompression of the nerve roots.); L2-3, L3-4, L4-5 and L5-S1 posterior lumbar interbody fusion with local morselized autograft bone, bone morphogenic protein-soaked collagen sponges and Kinnex graft extender; insertion of interbody prosthesis at L2-3, L3-4, L4-5 and L5-S1 (globus peek interbody prosthesis); posterior segmental instrumentation from L2 to S1 with globus titanium pedicle screws and rods; posterior lateral arthrodesis at L2-3, L3-4, L4-5 and L5-S1 with local morselized autograft bone and bone morphogenic protein-soaked collagen sponges, and Kinnex bone graft extender.  Surgeon: Dr. Delma Officer  Asst.: Dr. Shirlean Kelly  Anesthesia: Gen. endotracheal  Estimated blood loss: 1000 cc  Drains: One medium Hemovac  Complications: None  Description of procedure: The patient was brought to the operating room by the anesthesia team.  General endotracheal anesthesia was induced. The patient was turned to the prone position on the Wilson frame. The patient's lumbosacral region was then prepared with Betadine scrub and Betadine solution. Sterile drapes were applied.  I then injected the area to be incised with Marcaine with epinephrine solution. I then used the scalpel to make a linear midline incision over the L2-3, L3-4, L4-5 and L5-S1 interspace. I then used electrocautery to perform a bilateral subperiosteal dissection exposing the spinous process and lamina of L1-S1. We then obtained intraoperative radiograph to confirm our location. We then inserted the Verstrac retractor to provide exposure. I incised interspinous ligament at L3-4, L4-5 and L5-S1. I used Leksell rongeur to remove the spinous process of L3, L4 and L5. The L2 spinous process had already been removed at the previous operation.  I began the decompression by using the high speed drill to perform laminotomies at L2, L3, L4 and L5 bilaterally. I completed the L3, L4 and L5 laminectomy with a Kerrison punch. I removed the ligament flavum at L3-4, L4-5 and L5-S1 and some of the epidural scar tissue at L2-3.  We used the Kerrison punches to remove the medial facets at L2-3, L3-4, L4-5 and L5-S1. We performed wide foraminotomies about the bilateral L2, L3, L4, L5 and S1 nerve roots completing the decompression.  We now turned our attention to the posterior lumbar interbody fusion. I used a scalpel to incise the intervertebral disc at L2-3, L3-4, L4-5 and L5-S1 bilaterally. I then performed a partial intervertebral discectomy at L2-3, L3-4, L4-5 and L5-S1 bilaterally using the pituitary forceps. We prepared the vertebral endplates at L2-3, L3-4, L4-5 and L5-S1 bilaterally for the fusion by removing the soft tissues with the curettes. We then used the trial spacers to pick the appropriate sized interbody prosthesis. We prefilled his  prosthesis with a combination of local morselized  autograft bone that we obtained during the decompression as well as Kinnex bone graft extender. We inserted the prefilled prosthesis into the interspace at L2-3, L3-4, L4-5 and L5-S1 bilaterally. There was a good snug fit of the prosthesis in the interspace. We then filled and the remainder of the intervertebral disc space with local morselized autograft bone and Actifuse. This completed the posterior lumbar interbody arthrodesis.  We now turned attention to the instrumentation. Under fluoroscopic guidance we cannulated the bilateral L2, L3, L4, L5 and S1 pedicles with the bone probe. We then removed the bone probe. We then tapped the pedicle with a 6.5 millimeter tap. We then removed the tap. We probed inside the tapped pedicle with a ball probe to rule out cortical breaches. We then inserted a 7.5 X 50 and 45 millimeter pedicle screw into the L2, L3, L4, L5 and S1 pedicles bilaterally under fluoroscopic guidance. We then palpated along the medial aspect of the pedicles to rule out cortical breaches. There were none. The nerve roots were not injured. We then connected the unilateral pedicle screws with a lordotic rod. We compressed the construct and secured the rod in place with the caps. We then tightened the caps appropriately. We placed a cross connector between the rods. This completed the instrumentation from L2-S1.  We now turned our attention to the posterior lateral arthrodesis at L2-3, L3-4, L4-5 and L5-S1. We used the high-speed drill to decorticate the remainder of the facets, pars, transverse process at L2-3, L3-4, L4-5 and L5-S1. We then applied a combination of local morselized autograft bone, bone morphogenic protein-soaked collagen sponges, and Kinnex bone graft extender over these decorticated posterior lateral structures. This completed the posterior lateral arthrodesis.  We then obtained hemostasis using bipolar electrocautery. We irrigated the wound out with bacitracin solution. We  inspected the thecal sac and nerve roots and noted they were well decompressed. We then removed the retractor. We placed a medium Hemovac drain in the epidural space and tunneled out through separate stab wound. We reapproximated patient's thoracolumbar fascia with interrupted #1 Vicryl suture. We reapproximated patient's subcutaneous tissue with interrupted 2-0 Vicryl suture. The reapproximated patient's skin with Steri-Strips and benzoin. The wound was then coated with bacitracin ointment. A sterile dressing was applied. The drapes were removed. The patient was subsequently returned to the supine position where they were extubated by the anesthesia team. He was then transported to the post anesthesia care unit in stable condition. All sponge instrument and needle counts were reportedly correct at the end of this case.

## 2014-08-17 NOTE — Transfer of Care (Signed)
Immediate Anesthesia Transfer of Care Note  Patient: Christopher Hester  Procedure(s) Performed: Procedure(s) with comments: Lumbar two-three, Lumbar three-four, Lumbar four-five, Lumbar five-Sacral one Laminectomy and  Posterior Lumbar Interbody Fusion  (N/A) - Lumbar two-three, Lumbar three-four, Lumbar four-five, Lumbar five-Sacral one Laminectomy and  Posterior Lumbar Interbody Fusion   Patient Location: PACU  Anesthesia Type:General  Level of Consciousness: awake and alert   Airway & Oxygen Therapy: Patient Spontanous Breathing and Patient connected to nasal cannula oxygen  Post-op Assessment: Report given to PACU RN and Post -op Vital signs reviewed and stable  Post vital signs: Reviewed and stable  Complications: No apparent anesthesia complications

## 2014-08-18 ENCOUNTER — Encounter (HOSPITAL_COMMUNITY): Payer: Self-pay | Admitting: Neurosurgery

## 2014-08-18 LAB — BASIC METABOLIC PANEL
ANION GAP: 10 (ref 5–15)
BUN: 21 mg/dL (ref 6–23)
CHLORIDE: 103 meq/L (ref 96–112)
CO2: 26 meq/L (ref 19–32)
Calcium: 8 mg/dL — ABNORMAL LOW (ref 8.4–10.5)
Creatinine, Ser: 0.8 mg/dL (ref 0.50–1.35)
GFR calc non Af Amer: >90 mL/min (ref >90)
Glucose, Bld: 126 mg/dL — ABNORMAL HIGH (ref 70–99)
Potassium: 4.3 mEq/L (ref 3.7–5.3)
Sodium: 139 mEq/L (ref 137–147)

## 2014-08-18 LAB — CBC
HCT: 36.4 % — ABNORMAL LOW (ref 39.0–52.0)
Hemoglobin: 12.4 g/dL — ABNORMAL LOW (ref 13.0–17.0)
MCH: 30.9 pg (ref 26.0–34.0)
MCHC: 34.1 g/dL (ref 30.0–36.0)
MCV: 90.8 fL (ref 78.0–100.0)
Platelets: 155 10*3/uL (ref 150–400)
RBC: 4.01 MIL/uL — AB (ref 4.22–5.81)
RDW: 13.6 % (ref 11.5–15.5)
WBC: 14.3 10*3/uL — AB (ref 4.0–10.5)

## 2014-08-18 MED FILL — Heparin Sodium (Porcine) Inj 1000 Unit/ML: INTRAMUSCULAR | Qty: 30 | Status: AC

## 2014-08-18 MED FILL — Sodium Chloride IV Soln 0.9%: INTRAVENOUS | Qty: 1000 | Status: AC

## 2014-08-18 MED FILL — Sodium Chloride Irrigation Soln 0.9%: Qty: 3000 | Status: AC

## 2014-08-18 NOTE — Progress Notes (Signed)
Pt earlier admitted at shift changed, S/P surgery, c/o of burning sensation on both sides of chest around the breast area, area looks red and bruised with slight blisters on the right side, Dr Newell CoralNudelman (on call) paged and notified, said to just watch it, that Dr Lovell SheehanJenkins will review in the morning, cold watch cloth applied, pt reassured, will however continue to monitor. Christopher Hester, Christopher Hester

## 2014-08-18 NOTE — Progress Notes (Signed)
Pt has order for lumber brace but non in room,pt said he was not fitted for one, ortho Tech paged as ordered at 2130, said will check and order one in the morning, pt quiet in bed, family at bedside. [name], Christopher Hester

## 2014-08-18 NOTE — Progress Notes (Signed)
PT Cancellation Note  Patient Details Name: Christopher Hester MRN: [id number] DOB: 06-May-1956   Cancelled Treatment:    Reason Eval Not Completed: Pain limiting ability to participate  Attempted to see pt at 13:10 and brace had still not arrived, nor had MD rounded to address skin issues and brace. RN did obtain permission/order for pt to be OOB without his brace, however currently pt reports he is in too much pain (area of blisters and across his chest, in addition to back and Rt leg pain). He politely declined OOB at this time stating he wants to see the doctor before he tries to get up. Asked PT to return tomorrow.   [name] 08/18/2014, 3:42 PM Pager [phone]

## 2014-08-18 NOTE — Progress Notes (Signed)
PT Cancellation Note  Patient Details Name: Christopher Hester MRN: [id number] DOB: [birth year]   Cancelled Treatment:    Reason Eval Not Completed: Medical issues which prohibited therapy. Pt's brace has not been delivered, HOWEVER pt reports he does not think he can tolerate wearing brace due to blistered area over Rt anterior ribs. RN is aware. Await surgery input re: use of brace or no brace when getting OOB. Will follow-up later today.   Davielle Lingelbach 08/18/2014, 8:50 AM Pager [phone]

## 2014-08-18 NOTE — Progress Notes (Signed)
CARE MANAGEMENT NOTE 08/18/2014  Patient:  Christopher Hester,Christopher Hester   Account Number:  [id number]  Date Initiated:  08/18/2014  Documentation initiated by:  Jiles CrockerHANDLER,Tareek Sabo  Subjective/Objective Assessment:   ADMITTED FOR SURGERY     Action/Plan:   CM FOLLOWING FOR DCP   Anticipated DC Date:  08/22/2014   Anticipated DC Plan:  AWAITING FOR PT/OT EVALS FOR DISPOSITION NEEDS     DC Planning Services  CM consult         Status of service:  In process, will continue to follow Medicare Important Message given?   (If response is "NO", the following Medicare IM given date fields will be blank)  Per UR Regulation:  Reviewed for med. necessity/level of care/duration of stay  Comments:  10/8/2015Abelino Derrick- B Shia Eber RN,BSN,MHA [phone]

## 2014-08-19 MED ORDER — TAMSULOSIN HCL 0.4 MG PO CAPS
0.4000 mg | ORAL_CAPSULE | Freq: Once | ORAL | Status: AC
  Administered 2014-08-19: 0.4 mg via ORAL
  Filled 2014-08-19: qty 1

## 2014-08-19 MED ORDER — TAMSULOSIN HCL 0.4 MG PO CAPS
0.4000 mg | ORAL_CAPSULE | Freq: Every day | ORAL | Status: DC
Start: 2014-08-20 — End: 2014-08-23
  Administered 2014-08-20 – 2014-08-23 (×4): 0.4 mg via ORAL
  Filled 2014-08-19 (×4): qty 1

## 2014-08-19 NOTE — Progress Notes (Signed)
Patient ID: [name], male   DOB: Aug 02, 1956, 58 y.o.   MRN: [id number] Subjective:  I saw the patient but forgot to write a note. This isto document the note I when I saw him yesterday.the patient did not want to get out of bed or have the Foley removed until 08/19/2014.  Objective: Vital signs in last 24 hours: Temp:  [98.3 F (36.8 C)-101.1 F (38.4 C)] 100.6 F (38.1 C) (10/09 0600) Pulse Rate:  [79-99] 92 (10/09 0600) Resp:  [18-20] 18 (10/09 0600) BP: (114-124)/(64-71) 123/66 mmHg (10/09 0600) SpO2:  [90 %-100 %] 100 % (10/09 0600)  Intake/Output from previous day: 10/08 0701 - 10/09 0700 In: 360 [P.O.:360] Out: 3410 [Urine:3050; Drains:360] Intake/Output this shift:    Physical exam the patient is alert and pleasant. He is moving slowly extremities well. His dressing is clean and dry.  Lab Results:  Recent Labs  08/18/14 0745  WBC 14.3*  HGB 12.4*  HCT 36.4*  PLT 155   BMET  Recent Labs  08/18/14 0745  NA 139  K 4.3  CL 103  CO2 26  GLUCOSE 126*  BUN 21  CREATININE 0.80  CALCIUM 8.0*    Studies/Results: Dg Lumbar Spine Complete  08/17/2014   CLINICAL DATA:  58 year old male undergoing lumbar spine surgery. Initial encounter.  EXAM: DG C-ARM 61-120 MIN; LUMBAR SPINE - COMPLETE 4+ VIEW  TECHNIQUE: Four intraoperative fluoroscopic views of the lumbar spine.  CONTRAST:  None  FLUOROSCOPY TIME:  0 min 37 seconds  COMPARISON:  CT lumbar myelogram 07/04/2014  FINDINGS: Same numbering system as on the comparison. These images demonstrate placement of transpedicular and interbody fusion hardware from the S1 level to the L2 level. Connecting rods not yet in place. Suspect laminectomies also at these levels.  IMPRESSION: L2 to S1 level fusion and decompression underway.   Electronically Signed   By: Augusto GambleLee  Hall M.D.   On: 08/17/2014 16:19   Dg Lumbar Spine 1 View  08/17/2014   CLINICAL DATA:  Posterior lumbar fusion for congenital spondylolisthesis.  EXAM: LUMBAR  SPINE - 1 VIEW  COMPARISON:  CT scan of July 04, 2014.  FINDINGS: Single lateral intraoperative view of the lumbar spine was submitted for review. Multilevel degenerative disc disease is noted. Surgical probe is directed at posterior portion of L5-S1 disc space. Surgical retractors are seen in the soft tissues posterior to L2 through L5.  IMPRESSION: Surgical localization of L5-S1 as described above.   Electronically Signed   By: Roque LiasJames  Green M.D.   On: 08/17/2014 16:15   Dg C-arm 1-60 Min  08/17/2014   CLINICAL DATA:  58 year old male undergoing lumbar spine surgery. Initial encounter.  EXAM: DG C-ARM 61-120 MIN; LUMBAR SPINE - COMPLETE 4+ VIEW  TECHNIQUE: Four intraoperative fluoroscopic views of the lumbar spine.  CONTRAST:  None  FLUOROSCOPY TIME:  0 min 37 seconds  COMPARISON:  CT lumbar myelogram 07/04/2014  FINDINGS: Same numbering system as on the comparison. These images demonstrate placement of transpedicular and interbody fusion hardware from the S1 level to the L2 level. Connecting rods not yet in place. Suspect laminectomies also at these levels.  IMPRESSION: L2 to S1 level fusion and decompression underway.   Electronically Signed   By: Augusto GambleLee  Hall M.D.   On: 08/17/2014 16:19    Assessment/Plan: Postop day 1: The patient is doing well. We will mobilize him on 08/19/2014.   LOS: 2 days     Aleyna Cueva D 08/19/2014, 9:24 AM

## 2014-08-19 NOTE — Progress Notes (Signed)
Temp was 101.1 at 2200 and 100.6 this morning at 0600. Tylenol given as ordered. Will follow up.

## 2014-08-19 NOTE — Evaluation (Signed)
Physical Therapy Evaluation Patient Details Name: Christopher Hester MRN: [id number] DOB: 13-Nov-1955 Today's Date: 08/19/2014   History of Present Illness  [age] y.o. male admitted [facility] on [facility]/17/14 for elective L2-5 decompression and L2-S1 PLIF.  Pt with significant PMHx of HTN, and previous back [facility] in [facility].   Clinical Impression  Pt is very limited by pain and weakness R>L leg.  He has limited gait distance and is relying heavily on bil upper extremity support to do anything in standing.  He would benefit from and OT consult as education and training on ADLs and assistive devices used for ADLs would be very beneficial.  The pt lives alone and has limited support at discharge.  He may need to pursue SNF level rehab prior to going home.   PT to follow acutely for deficits listed below.       Follow Up Recommendations SNF    Equipment Recommendations  Rolling walker with 5" wheels    Recommendations for Other Services OT consult     Precautions / Restrictions Precautions Precautions: Fall;Back Precaution Booklet Issued: Yes (comment) Precaution Comments: Pt with right leg weakness and buckling.  Back handout given and reviewed as well as lifting restirctions, sitting restrictions, and encouraged walking TID at home.  Required Braces or Orthoses: Spinal Brace Spinal Brace: Lumbar corset;Applied in sitting position      Mobility  Bed Mobility Overal bed mobility: Needs Assistance Bed Mobility: Rolling;Sidelying to Sit Rolling: Min assist Sidelying to sit: Mod assist       General bed mobility comments: Min assist to support trunk and verbal cues for correct log roll technique to get to sidelying.  Pt with heavy reliance on upper extremities throughout bed mobility.  Mod assist needed to support trunk during transition to sit.   Transfers Overall transfer level: Needs assistance Equipment used: Rolling walker (2 wheeled) Transfers: Sit to/from Stand Sit to Stand: Min  assist         General transfer comment: Min assist to support trunk to get to standing from both bed and toilet.  Pt, again, relying heavily on upper extremity assist for transitions.  Assist needed at trunk to support trunk over weak legs.   Ambulation/Gait Ambulation/Gait assistance: Min assist Ambulation Distance (Feet): 15 Feet Assistive device: Rolling walker (2 wheeled) Gait Pattern/deviations: Step-through pattern;Decreased stance time - right;Decreased weight shift to right Gait velocity: decreased Gait velocity interpretation: Below normal speed for age/gender General Gait Details: Pt walking over flexed legs bil, but also weight shifting off of the painful right leg.  Pt with very limited gait tolerance (could not walk further than the bathroom to the recliner chair today).           Balance Overall balance assessment: Needs assistance Sitting-balance support: Feet supported;No upper extremity supported Sitting balance-Leahy Scale: Good     Standing balance support: Bilateral upper extremity supported Standing balance-Leahy Scale: Poor Standing balance comment: needs external assist to stand.  Was unable to let go of the RW in the bathroom to help with peri care or donning of his underwear.                              Pertinent Vitals/Pain Pain Assessment: 0-10 Pain Score: 8  Pain Location: low back and right leg Pain Descriptors / Indicators: Aching;Burning Pain Intervention(s): Limited activity within patient's tolerance;Monitored during session;Repositioned;Patient requesting pain meds-RN notified    Home Living Family/patient  expects to be discharged to:: Private residence Living Arrangements: Alone   Type of Home: House Home Access: Stairs to enter Entrance Stairs-Rails: None Entrance Stairs-Number of Steps: 6 Home Layout: One level Home Equipment: None      Prior Function Level of Independence: Independent         Comments: Pt  reports he walked three flights of stairs the second day [facility]        Extremity/Trunk Assessment   Upper Extremity Assessment: Defer to OT evaluation           Lower Extremity Assessment: RLE deficits/detail RLE Deficits / Details: right leg weak and buckling during gait.  Pt with heavy reliance on his upper extremities to support his lower extremities.     Cervical / Trunk Assessment: Other exceptions  Communication   Communication: No difficulties  Cognition Arousal/Alertness: Awake/alert Behavior During Therapy: WFL for tasks assessed/performed Overall Cognitive Status: Within Functional Limits for tasks assessed                               Assessment/Plan    PT Assessment Patient needs continued PT services  PT Diagnosis Difficulty walking;Abnormality of gait;Generalized weakness;Acute pain   PT Problem List Decreased strength;Decreased activity tolerance;Decreased balance;Decreased mobility;Decreased knowledge of use of DME;Decreased knowledge of precautions;Pain;Impaired sensation  PT Treatment Interventions DME instruction;Gait training;Stair training;Functional mobility training;Therapeutic activities;Therapeutic exercise;Balance training;Neuromuscular re-education;Patient/family education;Modalities   PT Goals (Current goals can be found in the Care Plan section) Acute Rehab PT Goals Patient Stated Goal: to get strong enough to go home PT Goal Formulation: With patient Time For Goal Achievement: 08/26/14 Potential to Achieve Goals: Good    Frequency Min 5X/week   Barriers to discharge Decreased caregiver support pt lives alone and has limited support system       End of Session Equipment Utilized During Treatment: Gait belt;Back brace Activity Tolerance: Patient limited by fatigue;Patient limited by pain Patient left: in chair;with call bell/phone within reach;with nursing/sitter in room Nurse Communication: Mobility status          Time: 4098-11911027-1102 PT Time Calculation (min): 35 min   Charges:   PT Evaluation $Initial PT Evaluation Tier I: 1 Procedure PT Treatments $Therapeutic Activity: 8-22 mins        Lacresha Fusilier B. Stanislaw Acton, PT, DPT [phone]   08/19/2014, 5:59 PM

## 2014-08-19 NOTE — Progress Notes (Signed)
Patient ID: Chilton SiJohn C Cozza, male   DOB: 1956/03/08, 58 y.o.   MRN: [id number] Subjective:  the patient is alert and pleasant. His back is appropriately sore.  Objective: Vital signs in last 24 hours: Temp:  [98.3 F (36.8 C)-101.1 F (38.4 C)] 100.6 F (38.1 C) (10/09 0600) Pulse Rate:  [79-99] 92 (10/09 0600) Resp:  [18-20] 18 (10/09 0600) BP: (114-124)/(64-71) 123/66 mmHg (10/09 0600) SpO2:  [90 %-100 %] 100 % (10/09 0600)  Intake/Output from previous day: 10/08 0701 - 10/09 0700 In: 360 [P.O.:360] Out: 3410 [Urine:3050; Drains:360] Intake/Output this shift:    Physical exam the patient is alert and oriented. His strength is grossly normal in his lower extremities.  Lab Results:  Recent Labs  08/18/14 0745  WBC 14.3*  HGB 12.4*  HCT 36.4*  PLT 155   BMET  Recent Labs  08/18/14 0745  NA 139  K 4.3  CL 103  CO2 26  GLUCOSE 126*  BUN 21  CREATININE 0.80  CALCIUM 8.0*    Studies/Results: Dg Lumbar Spine Complete  08/17/2014   CLINICAL DATA:  58 year old male undergoing lumbar spine surgery. Initial encounter.  EXAM: DG C-ARM 61-120 MIN; LUMBAR SPINE - COMPLETE 4+ VIEW  TECHNIQUE: Four intraoperative fluoroscopic views of the lumbar spine.  CONTRAST:  None  FLUOROSCOPY TIME:  0 min 37 seconds  COMPARISON:  CT lumbar myelogram 07/04/2014  FINDINGS: Same numbering system as on the comparison. These images demonstrate placement of transpedicular and interbody fusion hardware from the S1 level to the L2 level. Connecting rods not yet in place. Suspect laminectomies also at these levels.  IMPRESSION: L2 to S1 level fusion and decompression underway.   Electronically Signed   By: Augusto GambleLee  Hall M.D.   On: 08/17/2014 16:19   Dg Lumbar Spine 1 View  08/17/2014   CLINICAL DATA:  Posterior lumbar fusion for congenital spondylolisthesis.  EXAM: LUMBAR SPINE - 1 VIEW  COMPARISON:  CT scan of July 04, 2014.  FINDINGS: Single lateral intraoperative view of the lumbar spine was  submitted for review. Multilevel degenerative disc disease is noted. Surgical probe is directed at posterior portion of L5-S1 disc space. Surgical retractors are seen in the soft tissues posterior to L2 through L5.  IMPRESSION: Surgical localization of L5-S1 as described above.   Electronically Signed   By: Roque LiasJames  Green M.D.   On: 08/17/2014 16:15   Dg C-arm 1-60 Min  08/17/2014   CLINICAL DATA:  58 year old male undergoing lumbar spine surgery. Initial encounter.  EXAM: DG C-ARM 61-120 MIN; LUMBAR SPINE - COMPLETE 4+ VIEW  TECHNIQUE: Four intraoperative fluoroscopic views of the lumbar spine.  CONTRAST:  None  FLUOROSCOPY TIME:  0 min 37 seconds  COMPARISON:  CT lumbar myelogram 07/04/2014  FINDINGS: Same numbering system as on the comparison. These images demonstrate placement of transpedicular and interbody fusion hardware from the S1 level to the L2 level. Connecting rods not yet in place. Suspect laminectomies also at these levels.  IMPRESSION: L2 to S1 level fusion and decompression underway.   Electronically Signed   By: Augusto GambleLee  Hall M.D.   On: 08/17/2014 16:19    Assessment/Plan: Postop day #2: We'll discontinue the patient's Foley catheter and Hemovac drain. We will mobilize him with PT.   LOS: 2 days     Loralei Radcliffe D 08/19/2014, 9:26 AM

## 2014-08-19 NOTE — Progress Notes (Addendum)
1415 - bladder scan revealed 325 cc. Pt stated he would be able to go if he went to restroom. Nurse and nurse tech, Marchelle FolksAmanda, ambulated pt to restroom. Pt unable to void. Pt refused straight cath.  Nurse educated pt of complications of not emptying bladder including UTI, bladder damage/spasms, and kidney damage. Pt verbally acknowledged understanding. Nurse informed pt that repeat bladder scan at 1615 if unable to void by that time. Pt verbally acknowledged understanding. Will closely monitor. Pt's family members at bedside.   Andrew AuVafiadis, Coen Miyasato I 08/19/2014 3:01 PM

## 2014-08-19 NOTE — Progress Notes (Addendum)
1638 -  bladder scan revealed 460 cc. Pt stated he would be able to go if he went to restroom. Nurse ambulated pt to restroom. Pt unable to void. Pt refused straight cath. Nurse educated pt of complications of not emptying bladder including UTI, bladder damage/spasms, and kidney damage. Pt verbally acknowledged understanding.  Nurse notified Dr. Lovell SheehanJenkins via text message. Will monitor for MD's return phone call. Will closely monitor. Pt's family members at bedside. Andrew AuVafiadis, Shonnie Poudrier I  08/19/2014  4:43 PM

## 2014-08-20 NOTE — Progress Notes (Signed)
Patient ID: Christopher Hester, male   DOB: Oct 15, 1956, 58 y.o.   MRN: [id number] Subjective:  the patient is alert and pleasant. He looks and feels better today. He has not mobilize much yet.he has some urinary retention yesterday but this has resolved.  Objective: Vital signs in last 24 hours: Temp:  [97.7 F (36.5 C)-99.8 F (37.7 C)] 99.1 F (37.3 C) (10/10 0700) Pulse Rate:  [85-97] 91 (10/10 0700) Resp:  [18-20] 18 (10/10 0700) BP: (112-134)/(56-75) 130/70 mmHg (10/10 0700) SpO2:  [92 %-99 %] 95 % (10/10 0700) Weight:  [103.692 kg (228 lb 9.6 oz)] 103.692 kg (228 lb 9.6 oz) (10/09 1727)  Intake/Output from previous day: 10/09 0701 - 10/10 0700 In: 440 [P.O.:440] Out: 1175 [Urine:1175] Intake/Output this shift:    Physical exam the patient is alert and oriented x3. His strength is grossly normal his lower extremities.  Lab Results:  Recent Labs  08/18/14 0745  WBC 14.3*  HGB 12.4*  HCT 36.4*  PLT 155   BMET  Recent Labs  08/18/14 0745  NA 139  K 4.3  CL 103  CO2 26  GLUCOSE 126*  BUN 21  CREATININE 0.80  CALCIUM 8.0*    Studies/Results: No results found.  Assessment/Plan: Postop day #3: The patient is progressing well. His urinary retention has resolved. We will continue to mobilize him. He may go home tomorrow.   LOS: 3 days     Tyshae Stair D 08/20/2014, 7:33 AM

## 2014-08-20 NOTE — Progress Notes (Signed)
PT Cancellation Note  Patient Details Name: Christopher Hester MRN: [id number] DOB: 24-Feb-1956   Cancelled Treatment:     Pt deferring PT session due to pain 9/10.  Notified RN for pain medication.  Will attempt back later today if time allows otherwise will f/u tomorrow.      Verdell FaceKelly Cleven Jansma, [place] [phone] 08/20/2014

## 2014-08-21 NOTE — Progress Notes (Signed)
Pt & his daughter expressed interest in CIR rather than SNF.  Although this should be taken into consideration this therapist is unsure if pt can tolerate 3 hrs of therapy per day.  MD please order CIR consult if you feel appropriate.     Verdell FaceKelly Rhoda Waldvogel, [place] [phone] 08/21/2014

## 2014-08-21 NOTE — Progress Notes (Signed)
Patient ID: Christopher Hester, male   DOB: Aug 19, 1956, 58 y.o.   MRN: [id number] Subjective:  The patient is alert and pleasant. He hasn't mobilize much.  Objective: Vital signs in last 24 hours: Temp:  [97.9 F (36.6 C)-99.2 F (37.3 C)] 98 F (36.7 C) (10/11 1050) Pulse Rate:  [76-96] 76 (10/11 1050) Resp:  [18-20] 18 (10/11 1050) BP: (106-133)/(51-75) 106/51 mmHg (10/11 1050) SpO2:  [93 %-98 %] 97 % (10/11 1050)  Intake/Output from previous day: 10/10 0701 - 10/11 0700 In: 480 [P.O.:480] Out: -  Intake/Output this shift:    Physical exam patient is alert and oriented. His strength is grossly normal in his lower extremities. His dressing is clean and dry.  Lab Results: No results found for this basename: WBC, HGB, HCT, PLT,  in the last 72 hours BMET No results found for this basename: NA, K, CL, CO2, GLUCOSE, BUN, CREATININE, CALCIUM,  in the last 72 hours  Studies/Results: No results found.  Assessment/Plan: Postop day #4: We will continue to mobilize the patient. He may need rehabilitation.  LOS: 4 days     Christopher Hester D 08/21/2014, 11:09 AM

## 2014-08-21 NOTE — Progress Notes (Signed)
Reviewed and agree  Mckenna Boruff, PT  Acute Rehabilitation Services Pager [phone] Office [phone]  

## 2014-08-21 NOTE — Progress Notes (Signed)
Discussed pt status and dc options with Daneil DolinKelly, PTA, and agree;  Van ClinesHolly Aileena Iglesia, PT  Acute Rehabilitation Services Pager [phone] Office [phone]

## 2014-08-21 NOTE — Progress Notes (Signed)
Physical Therapy Treatment Patient Details Name: Christopher Hester MRN: [id number] DOB: 1956-05-25 Today's Date: 08/21/2014    History of Present Illness 58 y.o. male [facility] for elective L2-5 decompression and L2-S1 PLIF.  Pt with significant PMHx of HTN, and [facility] back surgery in [facility].     PT Comments    Pt progressing slowly with mobility due to pain & weakness.  Required heavy mod assist to achieve standing from bed this session but was able to increase gait short distance.   Pt & his daughter expressed interest in CIR rather than SNF.  However unsure if pt can tolerate 3 hrs therapy/day.  Cont with current POC & will f/u with pt tomorrow.       Follow Up Recommendations  SNF vs CIR     Equipment Recommendations  Rolling walker with 5" wheels    Recommendations for Other Services       Precautions / Restrictions Precautions Precautions: Fall;Back Precaution Comments: Pt with right leg weakness and buckling.  Back handout given and reviewed as well as lifting restirctions, sitting restrictions, and encouraged walking TID at home.  Required Braces or Orthoses: Spinal Brace Spinal Brace: Lumbar corset;Applied in sitting position    Mobility  Bed Mobility Overal bed mobility: Needs Assistance Bed Mobility: Sidelying to Sit   Sidelying to sit: Min guard       General bed mobility comments: Pt in sidelying position upon arrival.  Heavy reliance on bedrail with UE's to push himself to sitting upright.    Transfers Overall transfer level: Needs assistance Equipment used: Rolling walker (2 wheeled) Transfers: Sit to/from Stand Sit to Stand: Mod assist         General transfer comment: Required heavy mod assist to achieve standing/bring trunk up over LE's, & balance as he was transitioning hands from bed to walker.    Ambulation/Gait Ambulation/Gait assistance: Min guard Ambulation Distance (Feet): 25 Feet Assistive device: Rolling walker (2  wheeled) Gait Pattern/deviations: Step-to pattern;Decreased step length - right;Decreased step length - left;Narrow base of support Gait velocity: decreased   General Gait Details: pt walks with bil knees flexed,  narrow BOS, & heavy reliance on RW to support trunk over LE's.  Max encouragement to increase distance.  Pt's daughter followed with recliner.  Pt ambulated ~25' & then returned back to room via recliner.     Stairs            Wheelchair Mobility    Modified Rankin (Stroke Patients Only)       Balance                                    Cognition Arousal/Alertness: Awake/alert Behavior During Therapy: WFL for tasks assessed/performed Overall Cognitive Status: Within Functional Limits for tasks assessed                      Exercises      General Comments        Pertinent Vitals/Pain Pain Assessment: 0-10 Pain Score: 8  Pain Location: back & right leg Pain Descriptors / Indicators: Aching;Burning Pain Intervention(s): Monitored during session;Premedicated before session;Repositioned    Home Living                      Prior Function            PT Goals (current  goals can now be found in the care plan section) Acute Rehab PT Goals PT Goal Formulation: With patient Time For Goal Achievement: 08/26/14 Potential to Achieve Goals: Good Progress towards PT goals: Progressing toward goals (slowly)    Frequency  Min 5X/week    PT Plan Current plan remains appropriate    Co-evaluation             End of Session Equipment Utilized During Treatment: Gait belt;Back brace Activity Tolerance: Patient limited by pain Patient left: in chair;with call bell/phone within reach;with family/visitor present     Time: [id number] PT Time Calculation (min): 23 min  Charges:  $Gait Training: 8-22 mins $Therapeutic Activity: 8-22 mins                    G Codes:      Lara MulchCooper, Kamil Hanigan Lynn 08/21/2014, 12:42 PM  Verdell FaceKelly  Micholas Drumwright, PTA [phone] 08/21/2014

## 2014-08-22 MED ORDER — POLYETHYLENE GLYCOL 3350 17 G PO PACK
17.0000 g | PACK | Freq: Every day | ORAL | Status: DC
  Administered 2014-08-22 – 2014-08-23 (×2): 17 g via ORAL
  Filled 2014-08-22 (×2): qty 1

## 2014-08-22 MED ORDER — SENNA 8.6 MG PO TABS
1.0000 | ORAL_TABLET | Freq: Two times a day (BID) | ORAL | Status: DC
  Administered 2014-08-22 – 2014-08-23 (×3): 8.6 mg via ORAL
  Filled 2014-08-22 (×3): qty 1

## 2014-08-22 NOTE — Clinical Social Work Psychosocial (Signed)
Clinical Social Work Department BRIEF PSYCHOSOCIAL ASSESSMENT 08/22/2014  Patient:  Christopher Hester, Christopher Hester     Account Number:  [id number]     Admit date:  08/17/2014  Clinical Social Worker:  Marciano Sequin  Date/Time:  08/22/2014 01:49 PM  Referred by:  RN  Date Referred:  08/22/2014 Referred for  SNF Placement   Other Referral:   Interview type:  Patient Other interview type:    PSYCHOSOCIAL DATA Living Status:  FAMILY Admitted from facility:   Level of care:   Primary support name:  Freedman,April L Primary support relationship to patient:  CHILD, ADULT Degree of support available:   Strong Support System    CURRENT CONCERNS  Other Concerns:    SOCIAL WORK ASSESSMENT / PLAN CSW met pt at bedside. CSW introduce self and purpose of visit. Pt presented with a flat affect and calm mood.  Pt reported understanding that he may rehab. Pt reported that he does not care which area the SNF is in as long as it can accommodate his needs. CSW and pt discussed the clinical team recommendations for rehab. CSW explained the SNF process to the pt.  Pt reported that she will has her two daughter review the SNF list with her. CSW provided the pt with contact information for further questions. CSW will continue to follow this pt and assist with discharge as needed.   Assessment/plan status:  Information/Referral to [employer] Other assessment/ plan:   Information/referral to community resources:   SNF List    PATIENT'S/FAMILY'S RESPONSE TO PLAN OF CARE: Pt reported understanding and agrees with plan.    [place], MSW, [place]

## 2014-08-22 NOTE — Clinical Social Work Placement (Addendum)
Clinical Social Work Department CLINICAL SOCIAL WORK PLACEMENT NOTE 08/22/2014  Patient:  Chilton SiCARVER,Oluwatimileyin C  Account Number:  [id number] Admit date:  08/17/2014  Clinical Social Worker:  Gwynne EdingerYSHEKA Irais Mottram, LCSWA  Date/time:  08/22/2014 02:02 PM  Clinical Social Work is seeking post-discharge placement for this patient at the following level of care:   SKILLED NURSING   (*CSW will update this form in Epic as items are completed)   08/22/2014  Patient/family provided with [facility] Department of Clinical Social Work's list of facilities offering this level of care within the geographic area requested by the patient (or if unable, by the patient's family).  08/22/2014  Patient/family informed of their freedom to choose among providers that offer the needed level of care, that participate in Medicare, Medicaid or managed care program needed by the patient, have an available bed and are willing to accept the patient.  08/22/2014  Patient/family informed of [facility]' ownership interest in [facility], as well as of the fact that they are under no obligation to receive care at this facility.  PASARR submitted to EDS on 08/22/2014 PASARR number received on 08/22/2014  FL2 transmitted to all facilities in geographic area requested by pt/family on  08/22/2014 FL2 transmitted to all facilities within larger geographic area on 08/22/2014  Patient informed that his/her managed care company has contracts with or will negotiate with  certain facilities, including the following:     Patient/family informed of bed offers received:  08/22/2014 Patient chooses bed at [facility] Physician recommends and patient chooses bed at    Patient to be transferred to  on   Patient to be transferred to facility by  Patient and family notified of transfer on  Name of family member notified:    The following physician request were entered in Epic:  Additional Comments:  Teri Legacy,  MSW, LCSWA [phone]

## 2014-08-22 NOTE — Progress Notes (Signed)
PT Cancellation Note  Patient Details Name: Christopher Hester MRN: [id number] DOB: 05-10-1956   Cancelled Treatment:    Reason Eval/Treat Not Completed: Pain limiting ability to participate.  Pt reports 15/10 pain in bil legs and back.  He is in too much pain to participate with PT today.  RN made aware and reports she has not heard back from the doctor.  His pain is limiting his ability to work with PT and progress his mobility.  PT to check back again tomorrow.    Thanks,    Rollene Rotundaebecca B. Audrie Kuri, PT, DPT [phone]   08/22/2014, 5:24 PM

## 2014-08-22 NOTE — Progress Notes (Signed)
Pt stated that current pain regimen not providing adequate pain relief and stated "want a higher dose and stronger med."  Pt refused percocet and valium when nurse informed pt that he may get it now.  Nurse paged Dr. Lovell SheehanJenkins and informed. Will monitor for MD's return phone call.    Andrew AuVafiadis, Winter Jocelyn I 08/22/2014 4:38 PM

## 2014-08-22 NOTE — Progress Notes (Signed)
Patient ID: [name], male   DOB: 03/16/1956, 58 y.o.   MRN: [id number] Subjective:  the patient is alert and pleasant. His back is sore. He has been slow to mobilize. He wants to go to a skilled nursing facility.  Objective: Vital signs in last 24 hours: Temp:  [98.2 F (36.8 C)-99.9 F (37.7 C)] 98.9 F (37.2 C) (10/12 1415) Pulse Rate:  [78-100] 87 (10/12 1415) Resp:  [18-20] 20 (10/12 1415) BP: (118-130)/(59-86) 126/59 mmHg (10/12 1415) SpO2:  [94 %-99 %] 95 % (10/12 1415)  Intake/Output from previous day:   Intake/Output this shift: Total I/O In: 480 [P.O.:480] Out: -   Physical exam the patient is alert and oriented. He is moving his lower extremities well. His wound is healing well.  Lab Results: No results found for this basename: WBC, HGB, HCT, PLT,  in the last 72 hours BMET No results found for this basename: NA, K, CL, CO2, GLUCOSE, BUN, CREATININE, CALCIUM,  in the last 72 hours  Studies/Results: No results found.  Assessment/Plan: Postop day #5: We are awaiting skilled nursing facility placement.   LOS: 5 days     Christopher Hester D 08/22/2014, 5:42 PM

## 2014-08-23 MED ORDER — DIAZEPAM 5 MG PO TABS
5.0000 mg | ORAL_TABLET | Freq: Four times a day (QID) | ORAL | Status: DC | PRN
Start: 2014-08-23 — End: 2017-05-29

## 2014-08-23 MED ORDER — DSS 100 MG PO CAPS: 100.0000 mg | ORAL_CAPSULE | Freq: Two times a day (BID) | ORAL | Status: DC

## 2014-08-23 MED ORDER — OXYCODONE HCL ER 40 MG PO T12A
40.0000 mg | EXTENDED_RELEASE_TABLET | Freq: Two times a day (BID) | ORAL | Status: DC
  Administered 2014-08-23: 40 mg via ORAL
  Filled 2014-08-23: qty 4

## 2014-08-23 MED ORDER — TAMSULOSIN HCL 0.4 MG PO CAPS: 0.4000 mg | ORAL_CAPSULE | Freq: Every day | ORAL | Status: DC

## 2014-08-23 MED ORDER — OXYCODONE-ACETAMINOPHEN 5-325 MG PO TABS: 1.0000 | ORAL_TABLET | Freq: Four times a day (QID) | ORAL | Status: DC | PRN

## 2014-08-23 MED ORDER — OXYCODONE HCL ER 40 MG PO T12A
40.0000 mg | EXTENDED_RELEASE_TABLET | Freq: Two times a day (BID) | ORAL | Status: DC
Start: 2014-08-23 — End: 2017-05-29

## 2014-08-23 NOTE — Progress Notes (Signed)
[id number] - ambulance personnel arrived with stretcher. Pt to be escorted out of unit per stretcher. No acute distress noted. Pt belongings with ambulance personnel.  Andrew AuVafiadis, Donicia Druck I 08/23/2014 2:15 PM

## 2014-08-23 NOTE — Discharge Summary (Signed)
Physician Discharge Summary  Patient ID: Christopher Hester MRN: [id number] DOB/AGE: 1956-05-25 58 y.o.  Admit date: 08/17/2014 Discharge date: 08/23/2014  Admission Diagnoses: L2-3, L3-4, L4-5 and L5-S1 degenerative disc disease, facet arthropathy, stenosis, lumbago, lumbar radiculopathy, neurogenic claudication  Discharge Diagnoses: The same Active Problems:   Lumbar stenosis with neurogenic claudication   Discharged Condition: good  Hospital Course: I performed an L2-3, L3-4, L4-5 and L5-S1 decompression, instrumentation, and fusion on the patient on 08/17/2014. The surgery went well.  The patient's postoperative course was remarkable for some urinary retention. He was started on Flomax. This resolved. The patient was slow to mobilize. Arrangements were made for him to go to a skilled nursing facility for rehabilitation. Patient was given oral and written discharge instructions. All his questions were answered.  Consults: PT Significant Diagnostic Studies: None Treatments: L2-3, L3-4, L4-5 and L5-S1 decompression, instrumentation, and fusion. Discharge Exam: Blood pressure 137/74, pulse 74, temperature 98.2 F (36.8 C), temperature source Oral, resp. rate 18, height 5\' 8"  (1.727 m), weight 103.692 kg (228 lb 9.6 oz), SpO2 95.00%. The patient is alert and pleasant. His strength is normal in his lower extremities. His wound is healing well.  Disposition: Skilled nursing facility  Discharge Instructions   Call MD for:  difficulty breathing, headache or visual disturbances    Complete by:  As directed      Call MD for:  extreme fatigue    Complete by:  As directed      Call MD for:  hives    Complete by:  As directed      Call MD for:  persistant dizziness or light-headedness    Complete by:  As directed      Call MD for:  persistant nausea and vomiting    Complete by:  As directed      Call MD for:  redness, tenderness, or signs of infection (pain, swelling, redness, odor or  green/yellow discharge around incision site)    Complete by:  As directed      Call MD for:  severe uncontrolled pain    Complete by:  As directed      Call MD for:  temperature >100.4    Complete by:  As directed      Diet - low sodium heart healthy    Complete by:  As directed      Discharge instructions    Complete by:  As directed   Call [phone] for a followup appointment. Take a stool softener while you are using pain medications.     Driving Restrictions    Complete by:  As directed   Do not drive for 2 weeks.     Increase activity slowly    Complete by:  As directed      Lifting restrictions    Complete by:  As directed   Do not lift more than 5 pounds. No excessive bending or twisting.     May shower / Bathe    Complete by:  As directed   He may shower after the pain she is removed 3 days after surgery. Leave the incision alone.     No dressing needed    Complete by:  As directed             Medication List    STOP taking these medications       Acetaminophen 500 MG coapsule     methocarbamol 750 MG tablet  Commonly known as:  ROBAXIN  TAKE these medications       amLODipine 2.5 MG tablet  Commonly known as:  NORVASC  Take 2.5 mg by mouth daily. Takes in afternoon     diazepam 5 MG tablet  Commonly known as:  VALIUM  Take 1 tablet (5 mg total) by mouth every 6 (six) hours as needed for muscle spasms.     DULoxetine 60 MG capsule  Commonly known as:  CYMBALTA  Take 60 mg by mouth daily. Takes in the afternoon     OxyCODONE 40 mg T12a 12 hr tablet  Commonly known as:  OXYCONTIN  Take 1 tablet (40 mg total) by mouth every 12 (twelve) hours.     oxyCODONE-acetaminophen 5-325 MG per tablet  Commonly known as:  PERCOCET/ROXICET  Take 1-2 tablets by mouth every 6 (six) hours as needed for moderate pain.     PROAIR HFA 108 (90 BASE) MCG/ACT inhaler  Generic drug:  albuterol  Inhale 1-2 puffs into the lungs every 6 (six) hours as needed for  shortness of breath.     QVAR 40 MCG/ACT inhaler  Generic drug:  beclomethasone  Inhale 2 puffs into the lungs daily.     senna-docusate 8.6-50 MG per tablet  Commonly known as:  Senokot-S  Take 1 tablet by mouth daily as needed (for constipation).     tamsulosin 0.4 MG Caps capsule  Commonly known as:  FLOMAX  Take 1 capsule (0.4 mg total) by mouth daily.         SignedCristi Loron: Hani Patnode D 08/23/2014, 8:12 AM

## 2014-08-23 NOTE — Progress Notes (Signed)
Physical Therapy Treatment Patient Details Name: Christopher Hester C Fix MRN: [id number] DOB: 29-Mar-1956 Today's Date: 08/23/2014    History of Present Illness 58 y.o. male [facility] for elective L2-5 decompression and L2-S1 PLIF.  Pt with significant PMHx of HTN, and [facility]     PT Comments    Pt was agreeable to attempt walking into the hallway and progressing gait.  His pain is still high, but better controlled than when I saw him last evening.  Reinforced back precautions and brace use (as he wanted to take brace off while he was sitting in the recliner chair).  He continues to be appropriate for SNF level rehab at d/c.    Follow Up Recommendations  SNF     Equipment Recommendations  Rolling walker with 5" wheels    Recommendations for Other Services OT consult     Precautions / Restrictions Precautions Precautions: Fall;Back Precaution Comments: Pt with right leg weakness and buckling.  Back handout given and reviewed as well as lifting restirctions, sitting restrictions, and encouraged walking TID at home.  Required Braces or Orthoses: Spinal Brace Spinal Brace: Lumbar corset;Applied in sitting position Restrictions Weight Bearing Restrictions: No    Mobility  Bed Mobility Overal bed mobility: Needs Assistance Bed Mobility: Rolling;Sidelying to Sit Rolling: Supervision Sidelying to sit: Supervision       General bed mobility comments: Supervision for safety and verbal cues for correct log roll technique.  Heavy reliance on upper extremity support on railing to get to sitting.   Transfers Overall transfer level: Needs assistance Equipment used: Rolling walker (2 wheeled) Transfers: Sit to/from Stand Sit to Stand: Min assist         General transfer comment: Min assist to support trunk to get to standing over flexed and weak knees.  Slow transitions.  Heavy reliance on hands on RW to get to standing as pt can't safely make  transition if his hands are on the bed without significantly more support.  PT had to stabilize RW and support pt's trunk  Ambulation/Gait Ambulation/Gait assistance: Min assist Ambulation Distance (Feet): 55 Feet (25' x1 30'x1) Assistive device: Rolling walker (2 wheeled) Gait Pattern/deviations: Step-through pattern (bil knees flexed) Gait velocity: decreased Gait velocity interpretation: <1.8 ft/sec, indicative of risk for recurrent falls General Gait Details: Pt continues to rely heavily on bil upper extremity support over flexed knees during gait.  Decreased foot clearance bil.           Balance Overall balance assessment: Needs assistance Sitting-balance support: Feet supported;No upper extremity supported Sitting balance-Leahy Scale: Good Sitting balance - Comments: Pt needs reminders during seated rest break to sit up tall with good posture (he is leaning sideways on RW) as good posture is important while he is healing.    Standing balance support: Bilateral upper extremity supported Standing balance-Leahy Scale: Poor Standing balance comment: pt relies heavily on upper extremity support in standing to support his body over his weak legs.                     Cognition Arousal/Alertness: Awake/alert Behavior During Therapy: WFL for tasks assessed/performed Overall Cognitive Status: Within Functional Limits for tasks assessed                             Pertinent Vitals/Pain Pain Assessment: Faces Faces Pain Scale: Hurts even more Pain Location: low back, bil legs R>L  Pain Descriptors / Indicators: Aching;Burning Pain Intervention(s): Limited activity within patient's tolerance;Monitored during session;Premedicated before session;Repositioned;Patient requesting pain meds-RN notified           PT Goals (current goals can now be found in the care plan section) Acute Rehab PT Goals Patient Stated Goal: to get strong enough to go home Progress towards  PT goals: Progressing toward goals    Frequency  Min 5X/week    PT Plan Current plan remains appropriate       End of Session Equipment Utilized During Treatment: Back brace Activity Tolerance: Patient limited by fatigue;Patient limited by pain Patient left: in chair;with call bell/phone within reach;with chair alarm set     Time: 6433-29510932-1002 PT Time Calculation (min): 30 min  Charges:  $Gait Training: 8-22 mins $Therapeutic Activity: 8-22 mins                      Samel Bruna B. Kryssa Risenhoover, PT, DPT [phone]   08/23/2014, 10:11 AM

## 2014-08-23 NOTE — Progress Notes (Signed)
1345 - report given to Joni ReiningNicole, Charity fundraiserN at [facility] Ambulance to transport pt out of facility per stretcher. Will monitor.  Andrew AuVafiadis, Marquitta Persichetti I 08/23/2014 1:52 PM

## 2015-08-01 IMAGING — CR DG LUMBAR SPINE 1V
1 series · 1 of 1 positions shown · non-contrast
Comparison: CT scan of July 04, 2014.

CLINICAL DATA: Posterior lumbar fusion for congenital
spondylolisthesis.

EXAM:
LUMBAR SPINE - 1 VIEW

[lat]
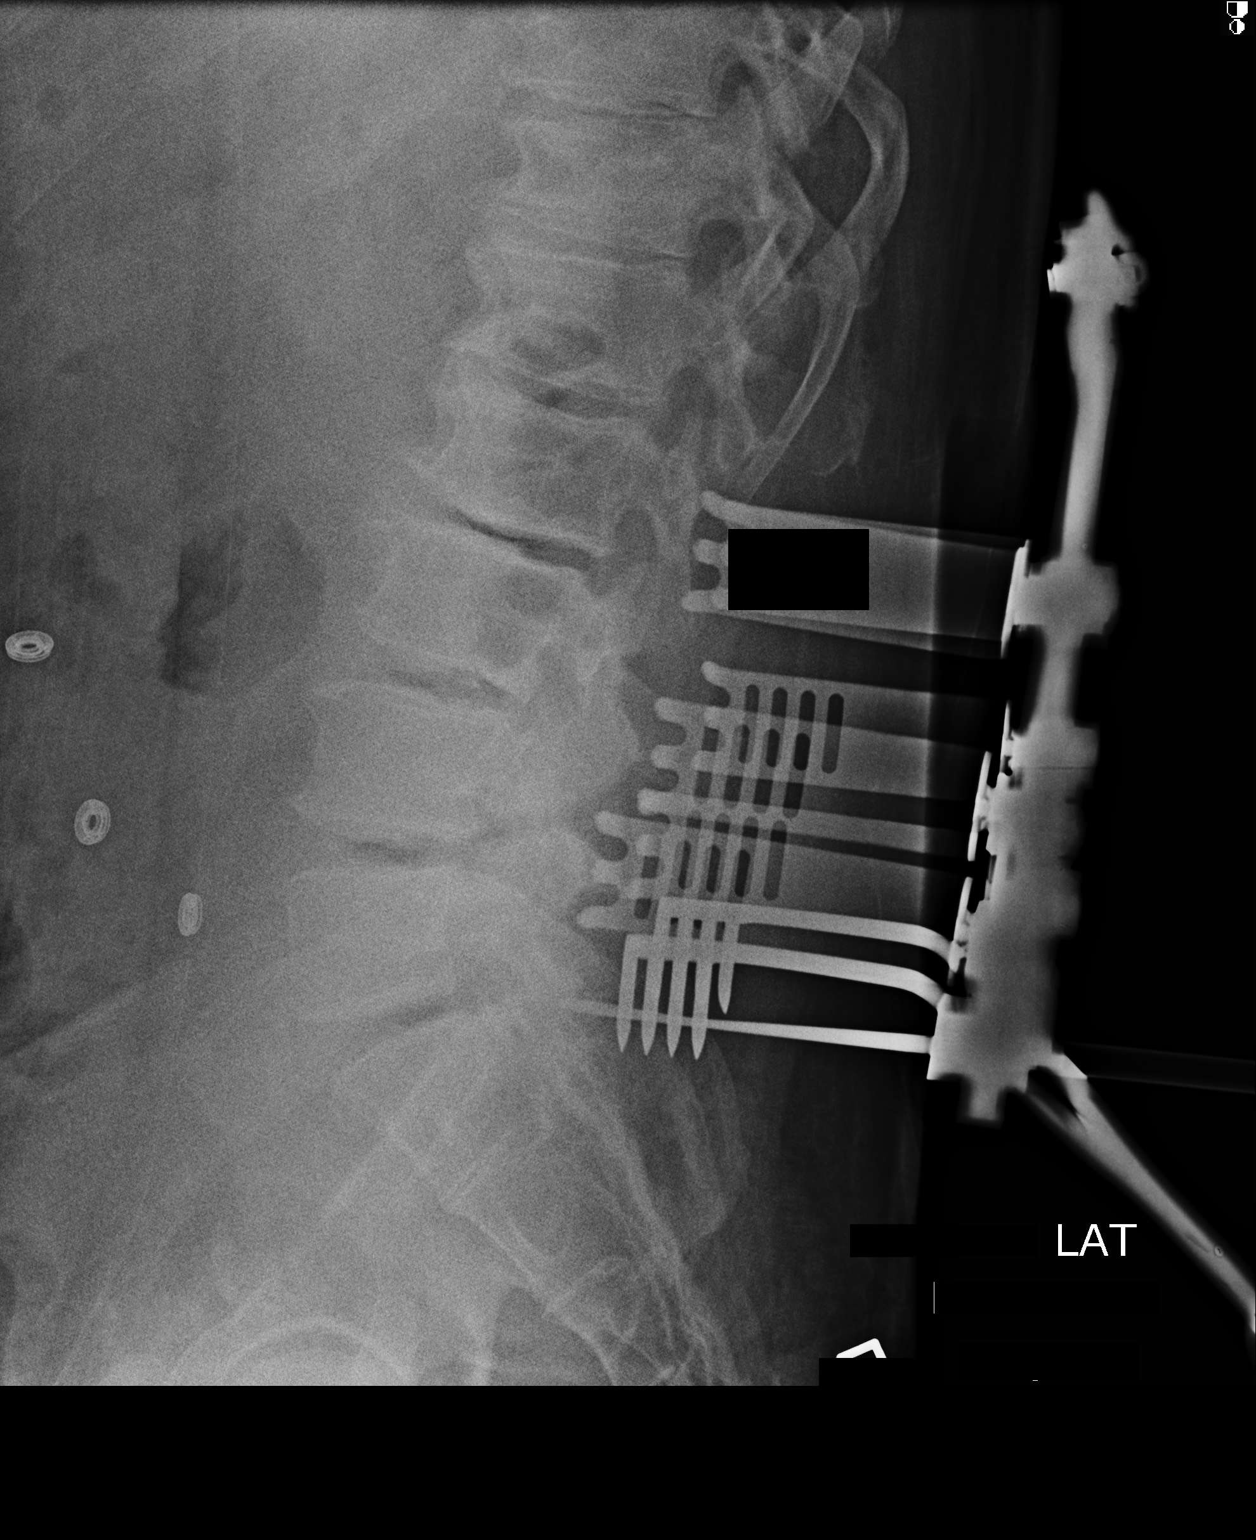

[1 of 1 positions shown; findings below may reference images not displayed]

FINDINGS: Single lateral intraoperative view of the lumbar spine was submitted
for review. Multilevel degenerative disc disease is noted. Surgical
probe is directed at posterior portion of L5-S1 disc space. Surgical
retractors are seen in the soft tissues posterior to L2 through L5.
IMPRESSION: Surgical localization of L5-S1 as described above.

## 2017-05-19 ENCOUNTER — Other Ambulatory Visit: Payer: Self-pay | Admitting: Neurosurgery

## 2017-05-19 DIAGNOSIS — M545 Low back pain: Principal | ICD-10-CM

## 2017-05-19 DIAGNOSIS — G8929 Other chronic pain: Secondary | ICD-10-CM

## 2017-05-23 ENCOUNTER — Ambulatory Visit
Admission: RE | Admit: 2017-05-23 | Discharge: 2017-05-23 | Disposition: A | Discharge status: Home / Self Care | Payer: Medicare HMO | Source: Ambulatory Visit | Attending: Neurosurgery | Admitting: Neurosurgery

## 2017-05-23 DIAGNOSIS — G8929 Other chronic pain: Secondary | ICD-10-CM | POA: Insufficient documentation

## 2017-05-23 DIAGNOSIS — M47897 Other spondylosis, lumbosacral region: Secondary | ICD-10-CM | POA: Diagnosis not present

## 2017-05-23 DIAGNOSIS — R600 Localized edema: Secondary | ICD-10-CM | POA: Insufficient documentation

## 2017-05-23 DIAGNOSIS — M545 Low back pain: Secondary | ICD-10-CM | POA: Diagnosis present

## 2017-05-23 DIAGNOSIS — M4807 Spinal stenosis, lumbosacral region: Secondary | ICD-10-CM | POA: Insufficient documentation

## 2017-05-23 LAB — POCT I-STAT CREATININE: Creatinine, Ser: 0.8 mg/dL (ref 0.61–1.24)

## 2017-05-23 MED ORDER — GADOBENATE DIMEGLUMINE 529 MG/ML IV SOLN
20.0000 mL | Freq: Once | INTRAVENOUS | Status: AC | PRN
  Administered 2017-05-23: 20 mL via INTRAVENOUS

## 2017-05-29 ENCOUNTER — Encounter: Payer: Self-pay | Admitting: Nurse Practitioner

## 2017-05-29 ENCOUNTER — Ambulatory Visit
Admission: RE | Admit: 2017-05-29 | Discharge: 2017-05-29 | Disposition: A | Discharge status: Home / Self Care | Payer: Medicare HMO | Source: Ambulatory Visit | Attending: Nurse Practitioner | Admitting: Nurse Practitioner

## 2017-05-29 ENCOUNTER — Ambulatory Visit: Payer: Medicare HMO | Attending: Nurse Practitioner | Admitting: Nurse Practitioner

## 2017-05-29 ENCOUNTER — Other Ambulatory Visit
Admission: RE | Admit: 2017-05-29 | Discharge: 2017-05-29 | Disposition: A | Discharge status: Home / Self Care | Payer: Medicare HMO | Source: Ambulatory Visit | Attending: Nurse Practitioner | Admitting: Nurse Practitioner

## 2017-05-29 VITALS — BP 119/88 | HR 88 | Temp 98.5°F | Resp 18 | Ht 68.0 in | Wt 210.0 lb

## 2017-05-29 DIAGNOSIS — M48062 Spinal stenosis, lumbar region with neurogenic claudication: Secondary | ICD-10-CM | POA: Insufficient documentation

## 2017-05-29 DIAGNOSIS — N189 Chronic kidney disease, unspecified: Secondary | ICD-10-CM | POA: Diagnosis not present

## 2017-05-29 DIAGNOSIS — M79605 Pain in left leg: Secondary | ICD-10-CM | POA: Insufficient documentation

## 2017-05-29 DIAGNOSIS — M79606 Pain in leg, unspecified: Secondary | ICD-10-CM | POA: Insufficient documentation

## 2017-05-29 DIAGNOSIS — J45909 Unspecified asthma, uncomplicated: Secondary | ICD-10-CM | POA: Diagnosis not present

## 2017-05-29 DIAGNOSIS — M546 Pain in thoracic spine: Secondary | ICD-10-CM | POA: Diagnosis not present

## 2017-05-29 DIAGNOSIS — Z836 Family history of other diseases of the respiratory system: Secondary | ICD-10-CM | POA: Diagnosis not present

## 2017-05-29 DIAGNOSIS — G894 Chronic pain syndrome: Secondary | ICD-10-CM

## 2017-05-29 DIAGNOSIS — Z79891 Long term (current) use of opiate analgesic: Secondary | ICD-10-CM | POA: Diagnosis not present

## 2017-05-29 DIAGNOSIS — M47894 Other spondylosis, thoracic region: Secondary | ICD-10-CM | POA: Diagnosis not present

## 2017-05-29 DIAGNOSIS — E782 Mixed hyperlipidemia: Secondary | ICD-10-CM | POA: Insufficient documentation

## 2017-05-29 DIAGNOSIS — Z811 Family history of alcohol abuse and dependence: Secondary | ICD-10-CM | POA: Diagnosis not present

## 2017-05-29 DIAGNOSIS — M79604 Pain in right leg: Secondary | ICD-10-CM | POA: Diagnosis not present

## 2017-05-29 DIAGNOSIS — M545 Low back pain: Secondary | ICD-10-CM | POA: Diagnosis not present

## 2017-05-29 DIAGNOSIS — G8929 Other chronic pain: Secondary | ICD-10-CM | POA: Insufficient documentation

## 2017-05-29 DIAGNOSIS — M1990 Unspecified osteoarthritis, unspecified site: Secondary | ICD-10-CM | POA: Diagnosis not present

## 2017-05-29 DIAGNOSIS — E079 Disorder of thyroid, unspecified: Secondary | ICD-10-CM | POA: Diagnosis not present

## 2017-05-29 DIAGNOSIS — I129 Hypertensive chronic kidney disease with stage 1 through stage 4 chronic kidney disease, or unspecified chronic kidney disease: Secondary | ICD-10-CM | POA: Diagnosis not present

## 2017-05-29 DIAGNOSIS — F419 Anxiety disorder, unspecified: Secondary | ICD-10-CM | POA: Insufficient documentation

## 2017-05-29 DIAGNOSIS — Z87891 Personal history of nicotine dependence: Secondary | ICD-10-CM | POA: Insufficient documentation

## 2017-05-29 DIAGNOSIS — Z8249 Family history of ischemic heart disease and other diseases of the circulatory system: Secondary | ICD-10-CM | POA: Diagnosis not present

## 2017-05-29 DIAGNOSIS — Z981 Arthrodesis status: Secondary | ICD-10-CM | POA: Diagnosis not present

## 2017-05-29 DIAGNOSIS — Z9889 Other specified postprocedural states: Secondary | ICD-10-CM | POA: Diagnosis not present

## 2017-05-29 DIAGNOSIS — F1190 Opioid use, unspecified, uncomplicated: Secondary | ICD-10-CM | POA: Insufficient documentation

## 2017-05-29 LAB — MAGNESIUM: MAGNESIUM: 1.9 mg/dL (ref 1.7–2.4)

## 2017-05-29 LAB — COMPREHENSIVE METABOLIC PANEL
ALT: 29 U/L (ref 17–63)
AST: 27 U/L (ref 15–41)
Albumin: 4 g/dL (ref 3.5–5.0)
Alkaline Phosphatase: 67 U/L (ref 38–126)
Anion gap: 11 (ref 5–15)
BUN: 17 mg/dL (ref 6–20)
CHLORIDE: 104 mmol/L (ref 101–111)
CO2: 26 mmol/L (ref 22–32)
Calcium: 9.2 mg/dL (ref 8.9–10.3)
Creatinine, Ser: 0.76 mg/dL (ref 0.61–1.24)
Glucose, Bld: 100 mg/dL — ABNORMAL HIGH (ref 65–99)
POTASSIUM: 4.7 mmol/L (ref 3.5–5.1)
Sodium: 141 mmol/L (ref 135–145)
Total Bilirubin: 0.8 mg/dL (ref 0.3–1.2)
Total Protein: 7.3 g/dL (ref 6.5–8.1)

## 2017-05-29 LAB — VITAMIN B12: VITAMIN B 12: 171 pg/mL — AB (ref 180–914)

## 2017-05-29 LAB — SEDIMENTATION RATE: SED RATE: 11 mm/h (ref 0–20)

## 2017-05-29 NOTE — Patient Instructions (Signed)

## 2017-05-29 NOTE — Progress Notes (Signed)
atient's Name: Christopher Hester  MRN: [id number]  Referring Provider: Newman Pies, MD  DOB: 17-Dec-1955  PCP: Adah Salvage, MD  DOS: 05/29/2017  Note by: Dionisio David NP  Service setting: Ambulatory outpatient  Specialty: Interventional Pain Management  Location: [facility] (AMB) Pain Management Facility    Patient type: New Patient    Primary Reason(s) for Visit: Initial Patient Evaluation CC: Back Pain (low  and right ); Groin Pain (bilaterall at times); and Leg Pain (right)  HPI  [name] is a 61 y.o. year old, male patient, who comes today for an initial evaluation. He has Anxiety; Mixed hyperlipidemia; Essential hypertension; Low back pain; Lumbar stenosis; Lumbar stenosis with neurogenic claudication; Long term current use of opiate analgesic; Chronic pain syndrome; Long term prescription opiate use; Opiate use; Thoracic back pain; and Lower extremity pain (tertiary) (bilateral) (R>L) on his problem list.. His primarily concern today is the Back Pain (low  and right ); Groin Pain (bilaterall at times); and Leg Pain (right)  Pain Assessment: Location:     Radiating:   Onset: More than a month ago Duration:   Quality: Sharp, Constant, Radiating, Stabbing Severity: 5 /10 (self-reported pain score)  Note: Reported level is compatible with observation.                   Effect on ADL:   Timing: Constant Modifying factors: medications, rest, water therapy  Onset and Duration: Gradual and Date of onset: 2012 Cause of pain: Unknown Severity: Getting better, NAS-11 at its worse: 10/10, NAS-11 at its best: 4/10, NAS-11 now: 4/10 and NAS-11 on the average: 4/10 Timing: Not influenced by the time of the day Aggravating Factors: Bending, Bowel movements, Kneeling, Lifiting, Stooping , Twisting, Walking uphill and Working Alleviating Factors: Resting Associated Problems: Night-time cramps, Dizziness, Nausea, Tingling, Weakness and Pain that does not allow patient to sleep Quality of Pain:  Aching, Cramping and Itching Previous Examinations or Tests: MRI scan Previous Treatments: Pool exercises  The patient comes into the clinics today for the first time for a chronic pain management evaluation. According to the patient's primary area pain is in his lower back. He denies any precipitating factors. He does admit that his right side is greater than the left. He has radicular symptoms that go up into his middle back and down his leg to the knee. He admits the pain comes and goes has some numbness tingling and weakness. He admits that it is based on his activity. He is status post lumbar spinal fusion. He admits that he has had injections by Dr. Sharlet Salina prior to his surgery. He admits the patient did not completely effective. He has had physical therapy post surgery. Which was effective. His last MRI was completed on 05/23/2017.  He is also having right flank pain. He denies any previous surgeries, interventions physical therapy or recent images.  He does have right foot pain. He admits this is related to gout and arthritis. He denies any previous surgeries or interventions physical therapy or recent images.  Today I took the time to provide the patient with information regarding this pain practice. The patient was informed that the practice is divided into two sections: an interventional pain management section, as well as a completely separate and distinct medication management section. I explained that there are procedure days for interventional therapies, and evaluation days for follow-ups and medication management. Because of the amount of documentation required during both, they are kept separated. This means that there is  the possibility that he may be scheduled for a procedure on one day, and medication management the next. I have also informed him that because of staffing and facility limitations, this practice will no longer take patients for medication management only. To illustrate  the reasons for this, I gave the patient the example of surgeons, and how inappropriate it would be to refer a patient to his/her care, just to write for the post-surgical antibiotics on a surgery done by a different surgeon.   Because interventional pain management is part of the board-certified specialty for the doctors, the patient was informed that joining this practice means that they are open to any and all interventional therapies. I made it clear that this does not mean that they will be forced to have any procedures done. What this means is that I believe interventional therapies to be essential part of the diagnosis and proper management of chronic pain conditions. Therefore, patients not interested in these interventional alternatives will be better served under the care of a different practitioner.  The patient was also made aware of my Comprehensive Pain Management Safety Guidelines where by joining this practice, they limit all of their nerve blocks and joint injections to those done by our practice, for as long as we are retained to manage their care. Historic Controlled Substance Pharmacotherapy Review  PMP and historical list of controlled substances: Hydrocodone/acetaminophen 5/325, tramadol 50 mg, hydrocodone acetaminophen 10/325 mg, oxycodone 5 mg, oxycodone/acetaminophen 5/325 mg, hydrocodone/7.5/325 mg, Highest opioid analgesic regimen found: Oxycodone 5 mg 2 tablets 7 times a day (oxycodone 70 mg per day) (fill date 10/09/2013 most) Most recent opioid analgesic: Hydrocodone/acetaminophen 5/325 mg 4 times daily (hydrocodone 20 mg by) (fill date 05/12/2017) Current opioid analgesics: Hydrocodone/acetaminophen 5/325 mg 4 times daily (hydrocodone 20 mg by) (fill date 05/12/2017) Highest recorded MME/day: 112.75 mg/day MME/day: 20 mg/day Medications: The patient did not bring the medication(s) to the appointment, as requested in our "New Patient Package" Pharmacodynamics: Desired  effects: Analgesia: The patient reports >50% benefit. Reported improvement in function: The patient reports medication allows him to accomplish basic ADLs. Clinically meaningful improvement in function (CMIF): Sustained CMIF goals met Perceived effectiveness: Described as relatively effective, allowing for increase in activities of daily living (ADL) Undesirable effects: Side-effects or Adverse reactions: None reported Historical Monitoring: The patient  reports that he does not use drugs. List of all UDS Test(s): No results found for: MDMA, COCAINSCRNUR, PCPSCRNUR, PCPQUANT, CANNABQUANT, THCU, [place] List of all Serum Drug Screening Test(s):  No results found for: AMPHSCRSER, BARBSCRSER, BENZOSCRSER, COCAINSCRSER, PCPSCRSER, PCPQUANT, THCSCRSER, CANNABQUANT, OPIATESCRSER, OXYSCRSER, PROPOXSCRSER Historical Background Evaluation: [place] PDMP: Six (6) year initial data search conducted.             [place] Department of public safety, offender search: Editor, commissioning Information) Non-contributory Risk Assessment Profile: Aberrant behavior: None observed or detected today Risk factors for fatal opioid overdose: None identified today Fatal overdose hazard ratio (HR): Calculation deferred Non-fatal overdose hazard ratio (HR): Calculation deferred Risk of opioid abuse or dependence: 0.7-3.0% with doses ? 36 MME/day and 6.1-26% with doses ? 120 MME/day. Substance use disorder (SUD) risk level: Pending results of Medical Psychology Evaluation for SUD Opioid risk tool (ORT) (Total Score): 3  ORT Scoring interpretation table:  Score <3 = Low Risk for SUD  Score between 4-7 = Moderate Risk for SUD  Score >8 = High Risk for Opioid Abuse   PHQ-2 Depression Scale:  Total score: 0  PHQ-2 Scoring interpretation table: (Score and probability  of major depressive disorder)  Score 0 = No depression  Score 1 = 15.4% Probability  Score 2 = 21.1% Probability  Score 3 = 38.4% Probability  Score 4 = 45.5% Probability   Score 5 = 56.4% Probability  Score 6 = 78.6% Probability   PHQ-9 Depression Scale:  Total score: 0  PHQ-9 Scoring interpretation table:  Score 0-4 = No depression  Score 5-9 = Mild depression  Score 10-14 = Moderate depression  Score 15-19 = Moderately severe depression  Score 20-27 = Severe depression (2.4 times higher risk of SUD and 2.89 times higher risk of overuse)   Pharmacologic Plan: Pending ordered tests and/or consults  Meds  The patient has a current medication list which includes the following prescription(s): albuterol, amlodipine, atorvastatin, beclomethasone, benazepril, duloxetine, indomethacin, tamsulosin, and tramadol.  Current Outpatient Prescriptions on File Prior to Visit  Medication Sig  . albuterol (PROAIR HFA) 108 (90 BASE) MCG/ACT inhaler Inhale 1-2 puffs into the lungs every 6 (six) hours as needed for shortness of breath.   Marland Kitchen amLODipine (NORVASC) 2.5 MG tablet Take 2.5 mg by mouth daily. Takes in afternoon  . beclomethasone (QVAR) 40 MCG/ACT inhaler Inhale 2 puffs into the lungs daily.   . DULoxetine (CYMBALTA) 60 MG capsule Take 60 mg by mouth daily. Takes in the afternoon  . tamsulosin (FLOMAX) 0.4 MG CAPS capsule Take 1 capsule (0.4 mg total) by mouth daily.   No current facility-administered medications on file prior to visit.    Imaging Review    Lumbosacral Imaging:  Lumbar MR wo contrast:  Results for orders placed in visit on 02/08/14  MR L Spine Ltd W/O Cm   Narrative * PRIOR REPORT IMPORTED FROM AN EXTERNAL SYSTEM *   CLINICAL DATA:  Low back pain. Pain in hips. Right leg pain. Prior  surgery in 09/2013.   EXAM:  MRI LUMBAR SPINE WITHOUT CONTRAST   TECHNIQUE:  Multiplanar, multisequence MR imaging was performed. No intravenous  contrast was administered.   COMPARISON:  No comparisons   FINDINGS:  There is grade 1 retrolisthesis of L2 on L3 and of L3 on L4, and  there is grade 1 anterolisthesis of L4 on L5 and L5 on S1. There is   moderate multilevel disc space narrowing, greatest at L2-3. Adjacent  degenerative marrow changes are present. Multiple small Schmorl's  nodes are noted. There is no vertebral marrow edema. There is  minimal anterior wedging of the T12 and L1 vertebral bodies,  chronic. The conus medullaris is normal in signal and terminates at  the superior aspect of L2. Postoperative changes are noted within  the superficial soft tissues of the midline of the lower back. Small  T2 hyperintense lesions are present in the right kidney, most likely  cyst but incompletely evaluated.   T11-12: Only imaged sagittally. Mild disc bulge without evidence of  stenosis.   T12-L1:  Mild disc bulge without stenosis.   L1-2:  Mild disc bulge without stenosis.   L2-3: Sequelae of prior laminectomies are identified. The spinal  canal is decompressed. There is a circumferential disc bulge with  small, superimposed left central inferiorly directed disc extrusion  partially extending into the left lateral recess. There is minimal  bilateral neural foraminal narrowing.   L3-4: Disc bulge and moderate facet and ligamentum flavum  hypertrophy result in mild spinal stenosis and moderate right and  severe left neural foraminal stenosis.   L4-5: Circumferential disc bulge with small superimposed superiorly  directed right central disc  extrusion. Disc bulge and moderate facet  and ligamentum flavum hypertrophy result in mild spinal stenosis and  severe right and moderate left neural foraminal stenosis.   L5-S1: Listhesis with uncovering of the disc and severe facet  arthrosis result in moderate bilateral neural foraminal stenosis  without spinal stenosis.   IMPRESSION:  1. Multilevel degenerative disc disease and facet arthrosis  resulting in mild spinal stenosis at L3-4 and L4-5. Neural foraminal  stenosis is severe on the left at L3-4 and on the right at L4-5.  2. Prior posterior decompression at L2-3 without  recurrent spinal  stenosis at this level.    Electronically Signed    By: Logan Bores    On: 02/08/2014 12:55       Lumbar MR w/wo contrast:  Results for orders placed during the hospital encounter of 05/23/17  MR Lumbar Spine W Wo Contrast   Narrative CLINICAL DATA:  61 y/o M; history lumbar fusions. Complaint of right-sided lower back pain radiating to the right lower extremity with numbness.  EXAM: MRI LUMBAR SPINE WITHOUT AND WITH CONTRAST  TECHNIQUE: Multiplanar and multiecho pulse sequences of the lumbar spine were obtained without and with intravenous contrast.  CONTRAST:  69m MULTIHANCE GADOBENATE DIMEGLUMINE 529 MG/ML IV SOLN  COMPARISON:  07/30/2016 lumbar spine radiographs. 07/04/2014 CT lumbar spine. 02/08/2014 lumbar spine MRI.  FINDINGS: Segmentation:  Standard.  Rudimentary S1-2 disc.  Alignment:  Physiologic.  Vertebrae: L2-S1 posterior instrumented fusion, L2-L5 interbody fusion, and laminectomy. Susceptibility artifact from fusion hardware partially obscures the vertebral bodies, canal, and neural foramen through levels of fusion. No appreciable bone marrow edema or enhancement.  Conus medullaris: Extends to the L1-2 level and appears normal. No abnormal enhancement of the visible caught less or cauda equina.  Paraspinal and other soft tissues: Postsurgical changes within subcutaneous fat and paraspinal muscles through level effusion. No abnormal enhancement or fluid collection. There is edema within the paraspinal muscles from the L4 level extending inferiorly below the field of view overlying sacrum.  Disc levels:  L1-2: Small disc bulge and mild facet hypertrophy. Mild bilateral foraminal stenosis. No significant canal stenosis.  L2-3: Discectomy and laminectomy. Facet hypertrophy with mild bilateral foraminal stenosis. No canal stenosis.  L3-4: Discectomy and laminectomy. Facet and endplate hypertrophy greater on the left with mild  left foraminal stenosis. No canal stenosis.  L4-5: Discectomy and laminectomy. Facet hypertrophy. No foraminal or canal stenosis.  L5-S1: Disc bulge and advanced facet hypertrophy with moderate to severe bilateral foraminal narrowing. No significant canal stenosis.  IMPRESSION: 1. L2-S1 posterior instrumented fusion and laminectomy. No bone marrow edema or apparent hardware related complication. No soft tissue fluid collection. 2. Lumbar degenerative changes greatest at the L5-S1 level where disc and facet disease contributes to moderate to severe bilateral foraminal narrowing. 3. Edema within bilateral paraspinal muscles from L4 to below field of view in sacrum, likely related to a muscle strain.   Electronically Signed   By: LKristine GarbeM.D.   On: 05/23/2017 19:14    Lumbar CT w contrast:  Results for orders placed during the hospital encounter of 07/04/14  CT Lumbar Spine W Contrast   Narrative CLINICAL DATA:  Lumbar radiculopathy. Decompressive lumbar laminectomy November 2014. Persistent low back and right greater than left lower extremity pain. The patient reports no significant improvement in his pain following the previous surgery.  EXAM: LUMBAR MYELOGRAM  FLUOROSCOPY TIME:  1 min 11 seconds.  PROCEDURE: After thorough discussion of risks and benefits of the procedure  including bleeding, infection, injury to nerves, blood vessels, adjacent structures as well as headache and CSF leak, written and oral informed consent was obtained. Consent was obtained by Dr. Lawrence Santiago. Time out form was completed.  Patient was positioned prone on the fluoroscopy table. Local anesthesia was provided with 1% lidocaine without epinephrine after prepped and draped in the usual sterile fashion. Puncture was performed at L5-S1 using a 3 1/2 inch 22-gauge spinal needle via left paramedian approach. Using a single pass through the dura, the needle was placed within  the thecal sac, with return of clear CSF. 15 mL of Omnipaque-180 was injected into the thecal sac, with normal opacification of the nerve roots and cauda equina consistent with free flow within the subarachnoid space.  I personally performed the lumbar puncture and administered the intrathecal contrast. I also personally supervised acquisition of the myelogram images.  TECHNIQUE: Contiguous axial images were obtained through the Lumbar spine after the intrathecal infusion of infusion. Coronal and sagittal reconstructions were obtained of the axial image sets.  COMPARISON:  MRI lumbar spine 02/08/2014.  FINDINGS: LUMBAR MYELOGRAM FINDINGS:  Slight retrolisthesis is present at L2-3 and L3-4. There is grade 1 anterolisthesis at L5-S1. Moderate central canal stenosis is present at L3-4 and L4-5 with some reticular stenosis worse on the right at L4-5 and on the left at L3-4. There is minimal subarticular narrowing at the S1 nerve roots bilaterally.  A disc protrusion is present at L2-3 without significant stenosis following laminectomy.  The upright images demonstrate no significant change in position or alignment. The disc protrusions are stable. There is no abnormal motion with flexion or extension.  CT LUMBAR MYELOGRAM FINDINGS:  The lumbar spine is imaged from the midbody of T11 through S2 3. Conus medullaris terminates at L1-2. Grade 1 retrolisthesis is stable at L2-3 and L3-4. Slight anterolisthesis is present at L5-S1. Gas is present in the disc spaces at every level from L1-2 through L5-S1.  Minimal atherosclerotic calcifications are present in the aorta and branch vessels. Limited in evaluation of the abdomen is otherwise unremarkable. Mild degenerative changes are noted at the SI joints bilaterally.  T11-12: Asymmetric right-sided facet hypertrophy is present. A broad-based disc protrusion is asymmetric to the left. This partially effaces the ventral  CSF.  T12-L1: Mild facet hypertrophy is present bilaterally. There is no significant stenosis.  L1-2: A shallow far right lateral disc protrusion is present without significant stenosis.  L2-3: The patient is status post wide laminectomy a left paramedian inferior disc extrusion is more prominent than on the prior exam. Gas and disc material extend 10 mm below the superior endplate of L3. Despite the laminectomy, this results and significant left subarticular stenosis. Bilateral foraminotomies are noted. No residual foraminal stenosis is present.  L3-4: A broad-based disc protrusion is associated with the retrolisthesis. Moderate facet hypertrophy and ligamentum flavum thickening is noted. Mild subarticular stenosis is stable, left greater than right. Mild to moderate foraminal narrowing is stable bilaterally.  L4-5: A a right paramedian disc extrusion extends superiorly 14 mm from the inferior endplate of L4. This results in moderate right subarticular stenosis. Facet hypertrophy contributes to moderate foraminal stenosis bilaterally, worse on the right.  L5-S1: A broad-based disc protrusion is present. There is uncovering of a broad-based disc protrusion is associated with grade 1 anterolisthesis. Moderate facet hypertrophy is evident. Mild foraminal narrowing is present bilaterally.  IMPRESSION: LUMBAR MYELOGRAM IMPRESSION:  1. Disc protrusion at L2-3 without significant stenosis following laminectomy. 2. Grade 1 retrolisthesis  at L3-4 with uncovering of a broad-based disc protrusion and moderate central canal stenosis. 3. Broad-based disc protrusion at L4-5 with subarticular stenosis bilaterally. 4. Grade 1 anterolisthesis at L5-S1 with uncovering of a broad-based disc protrusion and mild subarticular stenosis without significant central canal narrowing.  CT LUMBAR MYELOGRAM IMPRESSION:  1. Status post laminectomy at L2. 2. Persistent inferior left paramedian disc  extrusion extends 10 mm below the superior endplate of L3 resulting in left subarticular stenosis. 3. Stable shallow far right lateral disc protrusion at L1-2 without significant stenosis. 4. Bilateral foramina item a is at L2-3 without significant residual foraminal stenosis. 5. Mild subarticular stenosis mild to moderate foraminal stenosis at L3-4 is stable. 6. Superior right paramedian disc extrusion at L4-5 extends superiorly a 10 mm above the inferior endplate of L4 an results and moderate right subarticular stenosis. 7. Moderate foraminal narrowing bilaterally at L4-5 is worse on the right. 8. Mild foraminal narrowing bilaterally at L5-S1.   Electronically Signed   By: Lawrence Santiago M.D.   On: 07/04/2014 16:46    Lumbar DG 1V:  Results for orders placed during the hospital encounter of 08/17/14  DG Lumbar Spine 1 View   Narrative CLINICAL DATA:  Posterior lumbar fusion for congenital spondylolisthesis.  EXAM: LUMBAR SPINE - 1 VIEW  COMPARISON:  CT scan of July 04, 2014.  FINDINGS: Single lateral intraoperative view of the lumbar spine was submitted for review. Multilevel degenerative disc disease is noted. Surgical probe is directed at posterior portion of L5-S1 disc space. Surgical retractors are seen in the soft tissues posterior to L2 through L5.  IMPRESSION: Surgical localization of L5-S1 as described above.   Electronically Signed   By: Sabino Dick M.D.   On: 08/17/2014 16:15     Lumbar DG (Complete) 4+V:  Results for orders placed during the hospital encounter of 08/17/14  DG Lumbar Spine Complete   Narrative CLINICAL DATA:  61 year old male undergoing lumbar spine surgery. Initial encounter.  EXAM: DG C-ARM 61-120 MIN; LUMBAR SPINE - COMPLETE 4+ VIEW  TECHNIQUE: Four intraoperative fluoroscopic views of the lumbar spine.  CONTRAST:  None  FLUOROSCOPY TIME:  0 min 37 seconds  COMPARISON:  CT lumbar myelogram  07/04/2014  FINDINGS: Same numbering system as on the comparison. These images demonstrate placement of transpedicular and interbody fusion hardware from the S1 level to the L2 level. Connecting rods not yet in place. Suspect laminectomies also at these levels.  IMPRESSION: L2 to S1 level fusion and decompression underway.   Electronically Signed   By: Lars Pinks M.D.   On: 08/17/2014 16:19    Lumbar DG Myelogram Lumbosacral:  Results for orders placed during the hospital encounter of 07/04/14  DG MYELOGRAPHY LUMBAR INJ LUMBOSACRAL   Narrative CLINICAL DATA:  Lumbar radiculopathy. Decompressive lumbar laminectomy November 2014. Persistent low back and right greater than left lower extremity pain. The patient reports no significant improvement in his pain following the previous surgery.  EXAM: LUMBAR MYELOGRAM  FLUOROSCOPY TIME:  1 min 11 seconds.  PROCEDURE: After thorough discussion of risks and benefits of the procedure including bleeding, infection, injury to nerves, blood vessels, adjacent structures as well as headache and CSF leak, written and oral informed consent was obtained. Consent was obtained by Dr. Lawrence Santiago. Time out form was completed.  Patient was positioned prone on the fluoroscopy table. Local anesthesia was provided with 1% lidocaine without epinephrine after prepped and draped in the usual sterile fashion. Puncture was performed at L5-S1 using a  3 1/2 inch 22-gauge spinal needle via left paramedian approach. Using a single pass through the dura, the needle was placed within the thecal sac, with return of clear CSF. 15 mL of Omnipaque-180 was injected into the thecal sac, with normal opacification of the nerve roots and cauda equina consistent with free flow within the subarachnoid space.  I personally performed the lumbar puncture and administered the intrathecal contrast. I also personally supervised acquisition of the myelogram  images.  TECHNIQUE: Contiguous axial images were obtained through the Lumbar spine after the intrathecal infusion of infusion. Coronal and sagittal reconstructions were obtained of the axial image sets.  COMPARISON:  MRI lumbar spine 02/08/2014.  FINDINGS: LUMBAR MYELOGRAM FINDINGS:  Slight retrolisthesis is present at L2-3 and L3-4. There is grade 1 anterolisthesis at L5-S1. Moderate central canal stenosis is present at L3-4 and L4-5 with some reticular stenosis worse on the right at L4-5 and on the left at L3-4. There is minimal subarticular narrowing at the S1 nerve roots bilaterally.  A disc protrusion is present at L2-3 without significant stenosis following laminectomy.  The upright images demonstrate no significant change in position or alignment. The disc protrusions are stable. There is no abnormal motion with flexion or extension.  CT LUMBAR MYELOGRAM FINDINGS:  The lumbar spine is imaged from the midbody of T11 through S2 3. Conus medullaris terminates at L1-2. Grade 1 retrolisthesis is stable at L2-3 and L3-4. Slight anterolisthesis is present at L5-S1. Gas is present in the disc spaces at every level from L1-2 through L5-S1.  Minimal atherosclerotic calcifications are present in the aorta and branch vessels. Limited in evaluation of the abdomen is otherwise unremarkable. Mild degenerative changes are noted at the SI joints bilaterally.  T11-12: Asymmetric right-sided facet hypertrophy is present. A broad-based disc protrusion is asymmetric to the left. This partially effaces the ventral CSF.  T12-L1: Mild facet hypertrophy is present bilaterally. There is no significant stenosis.  L1-2: A shallow far right lateral disc protrusion is present without significant stenosis.  L2-3: The patient is status post wide laminectomy a left paramedian inferior disc extrusion is more prominent than on the prior exam. Gas and disc material extend 10 mm below the  superior endplate of L3. Despite the laminectomy, this results and significant left subarticular stenosis. Bilateral foraminotomies are noted. No residual foraminal stenosis is present.  L3-4: A broad-based disc protrusion is associated with the retrolisthesis. Moderate facet hypertrophy and ligamentum flavum thickening is noted. Mild subarticular stenosis is stable, left greater than right. Mild to moderate foraminal narrowing is stable bilaterally.  L4-5: A a right paramedian disc extrusion extends superiorly 14 mm from the inferior endplate of L4. This results in moderate right subarticular stenosis. Facet hypertrophy contributes to moderate foraminal stenosis bilaterally, worse on the right.  L5-S1: A broad-based disc protrusion is present. There is uncovering of a broad-based disc protrusion is associated with grade 1 anterolisthesis. Moderate facet hypertrophy is evident. Mild foraminal narrowing is present bilaterally.  IMPRESSION: LUMBAR MYELOGRAM IMPRESSION:  1. Disc protrusion at L2-3 without significant stenosis following laminectomy. 2. Grade 1 retrolisthesis at L3-4 with uncovering of a broad-based disc protrusion and moderate central canal stenosis. 3. Broad-based disc protrusion at L4-5 with subarticular stenosis bilaterally. 4. Grade 1 anterolisthesis at L5-S1 with uncovering of a broad-based disc protrusion and mild subarticular stenosis without significant central canal narrowing.  CT LUMBAR MYELOGRAM IMPRESSION:  1. Status post laminectomy at L2. 2. Persistent inferior left paramedian disc extrusion extends 10 mm below the  superior endplate of L3 resulting in left subarticular stenosis. 3. Stable shallow far right lateral disc protrusion at L1-2 without significant stenosis. 4. Bilateral foramina item a is at L2-3 without significant residual foraminal stenosis. 5. Mild subarticular stenosis mild to moderate foraminal stenosis at L3-4 is stable. 6.  Superior right paramedian disc extrusion at L4-5 extends superiorly a 10 mm above the inferior endplate of L4 an results and moderate right subarticular stenosis. 7. Moderate foraminal narrowing bilaterally at L4-5 is worse on the right. 8. Mild foraminal narrowing bilaterally at L5-S1.   Electronically Signed   By: Lawrence Santiago M.D.   On: 07/04/2014 16:46     Note: Available results from prior imaging studies were reviewed.        ROS  Cardiovascular History: High blood pressure Pulmonary or Respiratory History: Wheezing and difficulty taking a deep full breath (Asthma) and Snoring  Neurological History: No reported neurological signs or symptoms such as seizures, abnormal skin sensations, urinary and/or fecal incontinence, being born with an abnormal open spine and/or a tethered spinal cord Review of Past Neurological Studies: No results found for this or any previous visit. Psychological-Psychiatric History: No reported psychological or psychiatric signs or symptoms such as difficulty sleeping, anxiety, depression, delusions or hallucinations (schizophrenial), mood swings (bipolar disorders) or suicidal ideations or attempts Gastrointestinal History: No reported gastrointestinal signs or symptoms such as vomiting or evacuating blood, reflux, heartburn, alternating episodes of diarrhea and constipation, inflamed or scarred liver, or pancreas or irrregular and/or infrequent bowel movements Genitourinary History: No reported renal or genitourinary signs or symptoms such as difficulty voiding or producing urine, peeing blood, non-functioning kidney, kidney stones, difficulty emptying the bladder, difficulty controlling the flow of urine, or chronic kidney disease Hematological History: No reported hematological signs or symptoms such as prolonged bleeding, low or poor functioning platelets, bruising or bleeding easily, hereditary bleeding problems, low energy levels due to low hemoglobin or  being anemic Endocrine History: No reported endocrine signs or symptoms such as high or low blood sugar, rapid heart rate due to high thyroid levels, obesity or weight gain due to slow thyroid or thyroid disease Rheumatologic History: No reported rheumatological signs and symptoms such as fatigue, joint pain, tenderness, swelling, redness, heat, stiffness, decreased range of motion, with or without associated rash Musculoskeletal History: Negative for myasthenia gravis, muscular dystrophy, multiple sclerosis or malignant hyperthermia Work History: Disabled and Out of work due to pain  Allergies  Mr. Nill has No Known Allergies.  Laboratory Chemistry  Inflammation Markers Lab Results  Component Value Date   CRP <0.8 05/29/2017   ESRSEDRATE 11 05/29/2017   (CRP: Acute Phase) (ESR: Chronic Phase) Renal Function Markers Lab Results  Component Value Date   BUN 17 05/29/2017   CREATININE 0.76 05/29/2017   GFRAA >60 05/29/2017   GFRNONAA >60 05/29/2017   Hepatic Function Markers Lab Results  Component Value Date   AST 27 05/29/2017   ALT 29 05/29/2017   ALBUMIN 4.0 05/29/2017   ALKPHOS 67 05/29/2017   Electrolytes Lab Results  Component Value Date   NA 141 05/29/2017   K 4.7 05/29/2017   CL 104 05/29/2017   CALCIUM 9.2 05/29/2017   MG 1.9 05/29/2017   Neuropathy Markers Lab Results  Component Value Date   VITAMINB12 171 (L) 05/29/2017   Bone Pathology Markers Lab Results  Component Value Date   ALKPHOS 67 05/29/2017   VD25OH 32.6 05/29/2017   CALCIUM 9.2 05/29/2017   Coagulation Parameters Lab Results  Component Value  Date   PLT 155 08/18/2014   Cardiovascular Markers Lab Results  Component Value Date   HGB 12.4 (L) 08/18/2014   HCT 36.4 (L) 08/18/2014   Note: Lab results reviewed.  [place]  Drug: Mr. Sobek  reports that he does not use drugs. Alcohol:  reports that he does not drink alcohol. Tobacco:  reports that he has never smoked. He has quit using  smokeless tobacco. Medical:  has a past medical history of Arthritis; Asthma; Back pain; and Hypertension. Family: family history includes Alcohol abuse in his father; COPD in his father and mother; Hypertension in his mother.  Past Surgical History:  Procedure Laterality Date  . BACK SURGERY  Nov 2014   [facility]  . HERNIA REPAIR     as a child; ingunial right  . POSTERIOR LUMBAR FUSION 4 LEVEL N/A 08/17/2014   Procedure: Lumbar two-three, Lumbar three-four, Lumbar four-five, Lumbar five-Sacral one Laminectomy and  Posterior Lumbar Interbody Fusion ;  Surgeon: Newman Pies, MD;  Location: [facility];  Service: Neurosurgery;  Laterality: N/A;  Lumbar two-three, Lumbar three-four, Lumbar four-five, Lumbar five-Sacral one Laminectomy and  Posterior Lumbar Interbody Fusion    Active Ambulatory Problems    Diagnosis Date Noted  . Anxiety 07/04/2014  . Mixed hyperlipidemia 07/04/2014  . Essential hypertension 07/04/2014  . Low back pain 08/24/2013  . Lumbar stenosis 08/24/2013  . Lumbar stenosis with neurogenic claudication 08/17/2014  . Long term current use of opiate analgesic 05/29/2017  . Chronic pain syndrome 05/29/2017  . Long term prescription opiate use 05/29/2017  . Opiate use 05/29/2017  . Thoracic back pain 05/29/2017  . Lower extremity pain (tertiary) (bilateral) (R>L) 05/29/2017   Resolved Ambulatory Problems    Diagnosis Date Noted  . No Resolved Ambulatory Problems   Past Medical History:  Diagnosis Date  . Arthritis   . Asthma   . Back pain   . Hypertension    Constitutional Exam  General appearance: Well nourished, well developed, and well hydrated. In no apparent acute distress Vitals:   05/29/17 1152  BP: 119/88  Pulse: 88  Resp: 18  Temp: 98.5 F (36.9 C)  TempSrc: Oral  SpO2: 98%  Weight: 210 lb (95.3 kg)  Height: 5' 8"  (1.727 m)   BMI Assessment: Estimated body mass index is 31.93 kg/m as calculated from the following:   Height as of this  encounter: 5' 8"  (1.727 m).   Weight as of this encounter: 210 lb (95.3 kg).  BMI interpretation table: BMI level Category Range association with higher incidence of chronic pain  <18 kg/m2 Underweight   18.5-24.9 kg/m2 Ideal body weight   25-29.9 kg/m2 Overweight Increased incidence by 20%  30-34.9 kg/m2 Obese (Class I) Increased incidence by 68%  35-39.9 kg/m2 Severe obesity (Class II) Increased incidence by 136%  >40 kg/m2 Extreme obesity (Class III) Increased incidence by 254%   BMI Readings from Last 4 Encounters:  05/29/17 31.93 kg/m  08/19/14 34.76 kg/m  08/09/14 34.29 kg/m   Wt Readings from Last 4 Encounters:  05/29/17 210 lb (95.3 kg)  08/19/14 228 lb 9.6 oz (103.7 kg)  08/09/14 225 lb 8.5 oz (102.3 kg)  Psych/Mental status: Alert, oriented x 3 (person, place, & time)       Eyes: PERLA Respiratory: No evidence of acute respiratory distress  Cervical Spine Exam  Inspection: No masses, redness, or swelling Alignment: Symmetrical Functional ROM: Unrestricted ROM      Stability: No instability detected Muscle strength & Tone: Functionally intact  Sensory: Unimpaired Palpation: No palpable anomalies              Upper Extremity (UE) Exam    Side: Right upper extremity  Side: Left upper extremity  Inspection: No masses, redness, swelling, or asymmetry. No contractures  Inspection: No masses, redness, swelling, or asymmetry. No contractures  Functional ROM: Unrestricted ROM          Functional ROM: Unrestricted ROM          Muscle strength & Tone: Functionally intact  Muscle strength & Tone: Functionally intact  Sensory: Unimpaired  Sensory: Unimpaired  Palpation: No palpable anomalies              Palpation: No palpable anomalies              Specialized Test(s): Deferred         Specialized Test(s): Deferred          Thoracic Spine Exam  Inspection: Swelling to right lower area Alignment: Asymmetric Functional ROM: Pain restricted ROM Stability: No instability  detected Sensory: Unimpaired Muscle strength & Tone: Complains of area being tender to palpation  Lumbar Spine Exam  Inspection:  Swelling right flank area Alignment: Asymmetric Functional ROM: Pain restricted ROM      Stability: No instability detected Muscle strength & Tone: Increased muscle tone over affected area Sensory: Unimpaired Palpation: Complains of area being tender to palpation       Provocative Tests: Lumbar Hyperextension and rotation test: Positive on the right for facet joint pain. Patrick's Maneuver: Positive for right-sided S-I arthralgia              Gait & Posture Assessment  Ambulation: Unassisted Gait: Relatively normal for age and body habitus Posture: WNL   Lower Extremity Exam    Side: Right lower extremity  Side: Left lower extremity  Inspection: hallus valgus deformity to great toe  Inspection: hallus valgus deformity to great toe  Functional ROM: Unrestricted ROM          Functional ROM: Unrestricted ROM          Muscle strength & Tone: Functionally intact  Muscle strength & Tone: Functionally intact  Sensory: Unimpaired  Sensory: Unimpaired  Palpation: No palpable anomalies  Palpation: No palpable anomalies   Assessment  Primary Diagnosis & Pertinent Problem List: The primary encounter diagnosis was Chronic right-sided low back pain, with sciatica presence unspecified (primary). Diagnoses of Right-sided thoracic back pain, unspecified chronicity (secondary), Pain in both lower extremities, Chronic pain syndrome, and Long term current use of opiate analgesic were also pertinent to this visit.  Visit Diagnosis: 1. Chronic right-sided low back pain, with sciatica presence unspecified (primary)   2. Right-sided thoracic back pain, unspecified chronicity (secondary)   3. Pain in both lower extremities   4. Chronic pain syndrome   5. Long term current use of opiate analgesic    Plan of Care  Initial treatment plan:  Please be advised that as per  protocol, today's visit has been an evaluation only. We have not taken over the patient's controlled substance management.  Problem-specific plan: No problem-specific Assessment & Plan notes found for this encounter.  Ordered Lab-work, Procedure(s), Referral(s), & Consult(s): Orders Placed This Encounter  Procedures  . DG Thoracic Spine 2 View  . Compliance Drug Analysis, Ur  . Comprehensive metabolic panel  . C-reactive protein  . Sedimentation rate  . Magnesium  . 25-Hydroxyvitamin D Lcms D2+D3  . Vitamin B12  . Ambulatory referral to Psychology  Pharmacotherapy: Medications ordered:  No orders of the defined types were placed in this encounter.  Medications administered during this visit: Mr. Pautz had no medications administered during this visit.   Pharmacotherapy under consideration:  Opioid Analgesics: The patient was informed that there is no guarantee that he would be a candidate for opioid analgesics. The decision will be made following CDC guidelines. This decision will be based on the results of diagnostic studies, as well as Mr. Crescenzo risk profile.  Membrane stabilizer: To be determined at a later time Muscle relaxant: To be determined at a later time NSAID: To be determined at a later time Other analgesic(s): To be determined at a later time   Interventional therapies under consideration: Mr. Obeso was informed that there is no guarantee that he would be a candidate for interventional therapies. The decision will be based on the results of diagnostic studies, as well as Mr. Agustin risk profile.  Possible procedure(s): Possible transforaminal epidural steroid injection Possible lumbar facet injection Possible lumbar radiofrequency ablation    Provider-requested follow-up: Return for 2nd Visit, w/ Dr. Dossie Arbour, after [facility] eval.  No future appointments.  Primary Care Physician: Adah Salvage, MD Location: [facility]  Facility Note by:  Date: 05/29/2017; Time: 1:19 PM  Pain Score Disclaimer: We use the NRS-11 scale. This is a self-reported, subjective measurement of pain severity with only modest accuracy. It is used primarily to identify changes within a particular patient. It must be understood that outpatient pain scales are significantly less accurate that those used for research, where they can be applied under ideal controlled circumstances with minimal exposure to variables. In reality, the score is likely to be a combination of pain intensity and pain affect, where pain affect describes the degree of emotional arousal or changes in action readiness caused by the sensory experience of pain. Factors such as social and work situation, setting, emotional state, anxiety levels, expectation, and prior pain experience may influence pain perception and show large inter-individual differences that may also be affected by time variables.  Patient instructions provided during this appointment: Patient Instructions    ____________________________________________________________________________________________  Appointment Policy Summary  It is our goal and responsibility to provide the medical community with assistance in the evaluation and management of patients with chronic pain. Unfortunately our resources are limited. Because we do not have an unlimited amount of time, or available appointments, we are required to closely monitor and manage their use. The following rules exist to maximize their use:  Patient's responsibilities: 1. Punctuality:  At what time should I arrive? You should be physically present in our office 30 minutes before your scheduled appointment. Your scheduled appointment is with your assigned healthcare provider. However, it takes 5-10 minutes to be "checked-in", and another 15 minutes for the nurses to do the admission. If you arrive to our office at the time you were given for your  appointment, you will end up being at least 20-25 minutes late to your appointment with the provider. 2. Tardiness:  What happens if I arrive only a few minutes after my scheduled appointment time? You will need to reschedule your appointment. The cutoff is your appointment time. This is why it is so important that you arrive at least 30 minutes before that appointment. If you have an appointment scheduled for 10:00 AM and you arrive at 10:01, you will be required to reschedule your appointment.  3. Plan ahead:  Always assume that you will encounter traffic on your way  in. Plan for it. If you are dependent on a driver, make sure they understand these rules and the need to arrive early. 4. Other appointments and responsibilities:  Avoid scheduling any other appointments before or after your pain clinic appointments.  5. Be prepared:  Write down everything that you need to discuss with your healthcare provider and give this information to the admitting nurse. Write down the medications that you will need refilled. Bring your pills and bottles (even the empty ones), to all of your appointments, except for those where a procedure is scheduled. 6. No children or pets:  Find someone to take care of them. It is not appropriate to bring them in. 7. Scheduling changes:  We request "advanced notification" of any changes or cancellations. 8. Advanced notification:  Defined as a time period of more than 24 hours prior to the originally scheduled appointment. This allows for the appointment to be offered to other patients. 9. Rescheduling:  When a visit is rescheduled, it will require the cancellation of the original appointment. For this reason they both fall within the category of "Cancellations".  10. Cancellations:  They require advanced notification. Any cancellation less than 24 hours before the  appointment will be recorded as a "No Show". 11. No Show:  Defined as an unkept appointment where the  patient failed to notify or declare to the practice their intention or inability to keep the appointment.  Corrective process for repeat offenders:  1. Tardiness: Three (3) episodes of rescheduling due to late arrivals will be recorded as one (1) "No Show". 2. Cancellation or reschedule: Three (3) cancellations or rescheduling will be recorded as one (1) "No Show". 3. "No Shows": Three (3) "No Shows" within a 12 month period will result in discharge from the practice.  ____________________________________________________________________________________________  ____________________________________________________________________________________________  Pain Scale  Introduction: The pain score used by this practice is the Verbal Numerical Rating Scale (VNRS-11). This is an 11-point scale. It is for adults and children 10 years or older. There are significant differences in how the pain score is reported, used, and applied. Forget everything you learned in the past and learn this scoring system.  General Information: The scale should reflect your current level of pain. Unless you are specifically asked for the level of your worst pain, or your average pain. If you are asked for one of these two, then it should be understood that it is over the past 24 hours.  Basic Activities of Daily Living (ADL): Personal hygiene, dressing, eating, transferring, and using restroom.  Instructions: Most patients tend to report their level of pain as a combination of two factors, their physical pain and their psychosocial pain. This last one is also known as "suffering" and it is reflection of how physical pain affects you socially and psychologically. From now on, report them separately. From this point on, when asked to report your pain level, report only your physical pain. Use the following table for reference.  Pain Clinic Pain Levels (0-5/10)  Pain Level Score  Description  No Pain 0   Mild pain 1  Nagging, annoying, but does not interfere with basic activities of daily living (ADL). Patients are able to eat, bathe, get dressed, toileting (being able to get on and off the toilet and perform personal hygiene functions), transfer (move in and out of bed or a chair without assistance), and maintain continence (able to control bladder and bowel functions). Blood pressure and heart rate are unaffected. A normal heart rate for  a healthy adult ranges from 60 to 100 bpm (beats per minute).   Mild to moderate pain 2 Noticeable and distracting. Impossible to hide from other people. More frequent flare-ups. Still possible to adapt and function close to normal. It can be very annoying and may have occasional stronger flare-ups. With discipline, patients may get used to it and adapt.   Moderate pain 3 Interferes significantly with activities of daily living (ADL). It becomes difficult to feed, bathe, get dressed, get on and off the toilet or to perform personal hygiene functions. Difficult to get in and out of bed or a chair without assistance. Very distracting. With effort, it can be ignored when deeply involved in activities.   Moderately severe pain 4 Impossible to ignore for more than a few minutes. With effort, patients may still be able to manage work or participate in some social activities. Very difficult to concentrate. Signs of autonomic nervous system discharge are evident: dilated pupils (mydriasis); mild sweating (diaphoresis); sleep interference. Heart rate becomes elevated (>115 bpm). Diastolic blood pressure (lower number) rises above 100 mmHg. Patients find relief in laying down and not moving.   Severe pain 5 Intense and extremely unpleasant. Associated with frowning face and frequent crying. Pain overwhelms the senses.  Ability to do any activity or maintain social relationships becomes significantly limited. Conversation becomes difficult. Pacing back and forth is common, as getting into a  comfortable position is nearly impossible. Pain wakes you up from deep sleep. Physical signs will be obvious: pupillary dilation; increased sweating; goosebumps; brisk reflexes; cold, clammy hands and feet; nausea, vomiting or dry heaves; loss of appetite; significant sleep disturbance with inability to fall asleep or to remain asleep. When persistent, significant weight loss is observed due to the complete loss of appetite and sleep deprivation.  Blood pressure and heart rate becomes significantly elevated. Caution: If elevated blood pressure triggers a pounding headache associated with blurred vision, then the patient should immediately seek attention at an urgent or emergency care unit, as these may be signs of an impending stroke.    Emergency Department Pain Levels (6-10/10)  Emergency Room Pain 6 Severely limiting. Requires emergency care and should not be seen or managed at an outpatient pain management facility. Communication becomes difficult and requires great effort. Assistance to reach the emergency department may be required. Facial flushing and profuse sweating along with potentially dangerous increases in heart rate and blood pressure will be evident.   Distressing pain 7 Self-care is very difficult. Assistance is required to transport, or use restroom. Assistance to reach the emergency department will be required. Tasks requiring coordination, such as bathing and getting dressed become very difficult.   Disabling pain 8 Self-care is no longer possible. At this level, pain is disabling. The individual is unable to do even the most "basic" activities such as walking, eating, bathing, dressing, transferring to a bed, or toileting. Fine motor skills are lost. It is difficult to think clearly.   Incapacitating pain 9 Pain becomes incapacitating. Thought processing is no longer possible. Difficult to remember your own name. Control of movement and coordination are lost.   The worst pain  imaginable 10 At this level, most patients pass out from pain. When this level is reached, collapse of the autonomic nervous system occurs, leading to a sudden drop in blood pressure and heart rate. This in turn results in a temporary and dramatic drop in blood flow to the brain, leading to a loss of consciousness. Fainting is one of  the body's self defense mechanisms. Passing out puts the brain in a calmed state and causes it to shut down for a while, in order to begin the healing process.    Summary: 1. Refer to this scale when providing [place] with your pain level. 2. Be accurate and careful when reporting your pain level. This will help with your care. 3. Over-reporting your pain level will lead to loss of credibility. 4. Even a level of 1/10 means that there is pain and will be treated at our facility. 5. High, inaccurate reporting will be documented as "Symptom Exaggeration", leading to loss of credibility and suspicions of possible secondary gains such as obtaining more narcotics, or wanting to appear disabled, for fraudulent reasons. 6. Only pain levels of 5 or below will be seen at our facility. 7. Pain levels of 6 and above will be sent to the Emergency Department and the appointment cancelled. ____________________________________________________________________________________________

## 2017-05-29 NOTE — Progress Notes (Signed)
Safety precautions to be maintained throughout the outpatient stay will include: orient to surroundings, keep bed in low position, maintain call bell within reach at all times, provide assistance with transfer out of bed and ambulation.  

## 2017-05-30 LAB — C-REACTIVE PROTEIN

## 2017-05-30 LAB — VITAMIN D 25 HYDROXY (VIT D DEFICIENCY, FRACTURES): Vit D, 25-Hydroxy: 32.6 ng/mL (ref 30.0–100.0)

## 2017-06-04 ENCOUNTER — Telehealth: Payer: Self-pay | Admitting: *Deleted

## 2017-06-04 NOTE — Progress Notes (Signed)
Results were reviewed and found to be: mildly abnormal  No acute injury or pathology identified  Review would suggest interventional pain management techniques may be of benefit 

## 2017-06-05 LAB — COMPLIANCE DRUG ANALYSIS, UR

## 2017-06-05 NOTE — Telephone Encounter (Signed)
Spoke with patient re; thoracic xrays.  Results read and patient instructed that further detail will be given at next appt.  Patient verbalizes u/o information and will make 2nd appt after med/psych evaluation.

## 2017-06-19 ENCOUNTER — Other Ambulatory Visit: Payer: Self-pay

## 2018-01-25 ENCOUNTER — Emergency Department
Admission: EM | Admit: 2018-01-25 | Discharge: 2018-01-25 | Disposition: A | Discharge status: Home / Self Care | Payer: Medicare HMO | Attending: Emergency Medicine | Admitting: Emergency Medicine

## 2018-01-25 ENCOUNTER — Encounter: Payer: Self-pay | Admitting: Emergency Medicine

## 2018-01-25 ENCOUNTER — Ambulatory Visit
Admission: EM | Admit: 2018-01-25 | Discharge: 2018-01-25 | Disposition: A | Discharge status: Home / Self Care | Payer: Medicare HMO | Source: Home / Self Care | Attending: Family Medicine | Admitting: Family Medicine

## 2018-01-25 ENCOUNTER — Emergency Department: Payer: Medicare HMO

## 2018-01-25 ENCOUNTER — Other Ambulatory Visit: Payer: Self-pay

## 2018-01-25 DIAGNOSIS — Y929 Unspecified place or not applicable: Secondary | ICD-10-CM | POA: Diagnosis not present

## 2018-01-25 DIAGNOSIS — Y999 Unspecified external cause status: Secondary | ICD-10-CM | POA: Diagnosis not present

## 2018-01-25 DIAGNOSIS — J45909 Unspecified asthma, uncomplicated: Secondary | ICD-10-CM | POA: Diagnosis not present

## 2018-01-25 DIAGNOSIS — Y939 Activity, unspecified: Secondary | ICD-10-CM | POA: Insufficient documentation

## 2018-01-25 DIAGNOSIS — S0285XB Fracture of orbit, unspecified, initial encounter for open fracture: Secondary | ICD-10-CM

## 2018-01-25 DIAGNOSIS — S0990XA Unspecified injury of head, initial encounter: Secondary | ICD-10-CM | POA: Diagnosis not present

## 2018-01-25 DIAGNOSIS — S0181XA Laceration without foreign body of other part of head, initial encounter: Secondary | ICD-10-CM | POA: Diagnosis not present

## 2018-01-25 DIAGNOSIS — I10 Essential (primary) hypertension: Secondary | ICD-10-CM | POA: Insufficient documentation

## 2018-01-25 DIAGNOSIS — Z79899 Other long term (current) drug therapy: Secondary | ICD-10-CM | POA: Diagnosis not present

## 2018-01-25 DIAGNOSIS — S01511A Laceration without foreign body of lip, initial encounter: Secondary | ICD-10-CM | POA: Insufficient documentation

## 2018-01-25 DIAGNOSIS — S0993XA Unspecified injury of face, initial encounter: Secondary | ICD-10-CM | POA: Diagnosis not present

## 2018-01-25 DIAGNOSIS — S0280XB Fracture of other specified skull and facial bones, unspecified side, initial encounter for open fracture: Secondary | ICD-10-CM | POA: Diagnosis not present

## 2018-01-25 MED ORDER — AMOXICILLIN-POT CLAVULANATE 875-125 MG PO TABS: 1.0000 | ORAL_TABLET | Freq: Two times a day (BID) | ORAL | 0 refills | Status: AC

## 2018-01-25 MED ORDER — AMOXICILLIN-POT CLAVULANATE 875-125 MG PO TABS
ORAL_TABLET | ORAL | Status: AC
  Filled 2018-01-25: qty 1

## 2018-01-25 MED ORDER — LIDOCAINE-EPINEPHRINE 1 %-1:100000 IJ SOLN
10.0000 mL | Freq: Once | INTRAMUSCULAR | Status: AC
  Administered 2018-01-25: 10 mL via INTRADERMAL
  Filled 2018-01-25: qty 10

## 2018-01-25 MED ORDER — OXYCODONE-ACETAMINOPHEN 5-325 MG PO TABS: 1.0000 | ORAL_TABLET | ORAL | 0 refills | Status: DC | PRN

## 2018-01-25 MED ORDER — AMOXICILLIN-POT CLAVULANATE 875-125 MG PO TABS
1.0000 | ORAL_TABLET | Freq: Once | ORAL | Status: AC
  Administered 2018-01-25: 1 via ORAL
  Filled 2018-01-25: qty 1
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
## 2018-01-25 NOTE — ED Notes (Signed)
D/w Dr. Roxan Hockeyobinson, new orders for CT scans

## 2018-01-25 NOTE — Discharge Instructions (Signed)
[facility]  Take care  Dr. Adriana Simasook

## 2018-01-25 NOTE — ED Notes (Signed)
Wounds cleansed with NS and Hibiclens.

## 2018-01-25 NOTE — ED Notes (Signed)
ED Provider at bedside for suturing.  

## 2018-01-25 NOTE — ED Notes (Signed)
Pt is in radiology, notified to bring pt to 37 when pt is finished

## 2018-01-25 NOTE — ED Notes (Signed)
Wounds to forehead and near eyebrow covered with DSD and paper tape
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
## 2019-04-12 ENCOUNTER — Other Ambulatory Visit: Payer: Self-pay

## 2019-04-12 ENCOUNTER — Emergency Department: Payer: Medicare HMO

## 2019-04-12 ENCOUNTER — Emergency Department
Admission: EM | Admit: 2019-04-12 | Discharge: 2019-04-12 | Disposition: A | Discharge status: Home / Self Care | Payer: Medicare HMO | Attending: Emergency Medicine | Admitting: Emergency Medicine

## 2019-04-12 DIAGNOSIS — I10 Essential (primary) hypertension: Secondary | ICD-10-CM | POA: Insufficient documentation

## 2019-04-12 DIAGNOSIS — J45909 Unspecified asthma, uncomplicated: Secondary | ICD-10-CM | POA: Diagnosis not present

## 2019-04-12 DIAGNOSIS — R1907 Generalized intra-abdominal and pelvic swelling, mass and lump: Secondary | ICD-10-CM | POA: Diagnosis present

## 2019-04-12 DIAGNOSIS — Z87891 Personal history of nicotine dependence: Secondary | ICD-10-CM | POA: Insufficient documentation

## 2019-04-12 DIAGNOSIS — R29898 Other symptoms and signs involving the musculoskeletal system: Secondary | ICD-10-CM | POA: Diagnosis not present

## 2019-04-12 DIAGNOSIS — K439 Ventral hernia without obstruction or gangrene: Secondary | ICD-10-CM | POA: Insufficient documentation

## 2019-04-12 DIAGNOSIS — Z79899 Other long term (current) drug therapy: Secondary | ICD-10-CM | POA: Insufficient documentation

## 2019-04-12 LAB — PROTIME-INR
INR: 1 (ref 0.8–1.2)
Prothrombin Time: 13.4 seconds (ref 11.4–15.2)

## 2019-04-12 LAB — CBC WITH DIFFERENTIAL/PLATELET
Abs Immature Granulocytes: 0.08 10*3/uL — ABNORMAL HIGH (ref 0.00–0.07)
Basophils Absolute: 0.1 10*3/uL (ref 0.0–0.1)
Basophils Relative: 1 %
Eosinophils Absolute: 0.2 10*3/uL (ref 0.0–0.5)
Eosinophils Relative: 2 %
HCT: 43.9 % (ref 39.0–52.0)
Hemoglobin: 15 g/dL (ref 13.0–17.0)
Immature Granulocytes: 1 %
Lymphocytes Relative: 36 %
Lymphs Abs: 4.3 10*3/uL — ABNORMAL HIGH (ref 0.7–4.0)
MCH: 30.7 pg (ref 26.0–34.0)
MCHC: 34.2 g/dL (ref 30.0–36.0)
MCV: 90 fL (ref 80.0–100.0)
Monocytes Absolute: 0.7 10*3/uL (ref 0.1–1.0)
Monocytes Relative: 6 %
Neutro Abs: 6.5 10*3/uL (ref 1.7–7.7)
Neutrophils Relative %: 54 %
Platelets: 227 10*3/uL (ref 150–400)
RBC: 4.88 MIL/uL (ref 4.22–5.81)
RDW: 13.4 % (ref 11.5–15.5)
WBC: 11.8 10*3/uL — ABNORMAL HIGH (ref 4.0–10.5)
nRBC: 0 % (ref 0.0–0.2)

## 2019-04-12 LAB — COMPREHENSIVE METABOLIC PANEL
ALT: 23 U/L (ref 0–44)
AST: 25 U/L (ref 15–41)
Albumin: 3.7 g/dL (ref 3.5–5.0)
Alkaline Phosphatase: 69 U/L (ref 38–126)
Anion gap: 8 (ref 5–15)
BUN: 21 mg/dL (ref 8–23)
CO2: 24 mmol/L (ref 22–32)
Calcium: 8.4 mg/dL — ABNORMAL LOW (ref 8.9–10.3)
Chloride: 107 mmol/L (ref 98–111)
Creatinine, Ser: 0.7 mg/dL (ref 0.61–1.24)
GFR calc Af Amer: >60 mL/min (ref >60)
GFR calc non Af Amer: >60 mL/min (ref >60)
Glucose, Bld: 102 mg/dL — ABNORMAL HIGH (ref 70–99)
Potassium: 4.2 mmol/L (ref 3.5–5.1)
Sodium: 139 mmol/L (ref 135–145)
Total Bilirubin: 0.6 mg/dL (ref 0.3–1.2)
Total Protein: 6.5 g/dL (ref 6.5–8.1)

## 2019-04-12 LAB — APTT: aPTT: 29 seconds (ref 24–36)

## 2019-04-12 MED ORDER — IOHEXOL 300 MG/ML  SOLN
100.0000 mL | Freq: Once | INTRAMUSCULAR | Status: AC | PRN
  Administered 2019-04-12: 100 mL via INTRAVENOUS
  Filled 2019-04-12: qty 100

## 2019-04-12 MED ORDER — TRAMADOL HCL 50 MG PO TABS
50.0000 mg | ORAL_TABLET | Freq: Once | ORAL | Status: AC
  Administered 2019-04-12: 50 mg via ORAL
  Filled 2019-04-12: qty 1

## 2019-04-12 MED ORDER — HYDROCODONE-ACETAMINOPHEN 5-325 MG PO TABS: 1.0000 | ORAL_TABLET | Freq: Four times a day (QID) | ORAL | 0 refills | Status: DC | PRN

## 2019-04-12 MED ORDER — TRAMADOL HCL 50 MG PO TABS: 50.0000 mg | ORAL_TABLET | Freq: Four times a day (QID) | ORAL | 0 refills | Status: DC | PRN

## 2019-04-12 NOTE — ED Provider Notes (Signed)
[facility] Emergency Department Provider Note  ____________________________________________   None    (approximate)  I have reviewed the triage vital signs and the nursing notes.   HISTORY  Chief Complaint Numbness and Back Pain    HPI Christopher Hester is a 63 y.o. male presents emergency department from [facility].  The patient has been complaining of back pain and bilateral leg pain along with right-sided abdominal swelling.  Leg pain/numbness has been ongoing for about 6 months but has worsened since he has the swelling on the right side of his abdomen.  He states that the swelling to the abdomen started approximately 2 weeks ago but extremely worse in the last 2 days.  States he did drink a lot of alcohol previous to 7 years ago.  He states now he may get a drink once a week.  The patient denies any known history of cancer or cirrhosis.  He has a history of multiple back surgeries.    Past Medical History:  Diagnosis Date  . Arthritis   . Asthma   . Back pain   . Hypertension     Patient Active Problem List   Diagnosis Date Noted  . Long term current use of opiate analgesic 05/29/2017  . Chronic pain syndrome 05/29/2017  . Long term prescription opiate use 05/29/2017  . Opiate use 05/29/2017  . Thoracic back pain 05/29/2017  . Lower extremity pain (tertiary) (bilateral) (R>L) 05/29/2017  . Lumbar stenosis with neurogenic claudication 08/17/2014  . Anxiety 07/04/2014  . Mixed hyperlipidemia 07/04/2014  . Essential hypertension 07/04/2014  . Low back pain 08/24/2013  . Lumbar stenosis 08/24/2013    Past Surgical History:  Procedure Laterality Date  . BACK SURGERY  Nov 2014   [facility]  . HERNIA REPAIR     as a child; ingunial right  . POSTERIOR LUMBAR FUSION 4 LEVEL N/A 08/17/2014   Procedure: Lumbar two-three, Lumbar three-four, Lumbar four-five, Lumbar five-Sacral one Laminectomy and  Posterior Lumbar Interbody Fusion ;  Surgeon: Tressie StalkerJeffrey  Jenkins, MD;  Location: [facility];  Service: Neurosurgery;  Laterality: N/A;  Lumbar two-three, Lumbar three-four, Lumbar four-five, Lumbar five-Sacral one Laminectomy and  Posterior Lumbar Interbody Fusion     Prior to Admission medications   Medication Sig Start Date End Date Taking? Authorizing Provider  albuterol (PROAIR HFA) 108 (90 BASE) MCG/ACT inhaler Inhale 1-2 puffs into the lungs every 6 (six) hours as needed for shortness of breath.     [provider]  amLODipine (NORVASC) 2.5 MG tablet Take 2.5 mg by mouth daily. Takes in afternoon    [provider]  atorvastatin (LIPITOR) 20 MG tablet Take 20 mg by mouth daily.    [provider]  beclomethasone (QVAR) 40 MCG/ACT inhaler Inhale 2 puffs into the lungs daily.     [provider]  benazepril (LOTENSIN) 10 MG tablet Take 10 mg by mouth daily.    [provider]  DULoxetine (CYMBALTA) 60 MG capsule Take 60 mg by mouth daily. Takes in the afternoon 10/29/13   [provider]  HYDROcodone-acetaminophen (NORCO/VICODIN) 5-325 MG tablet Take 1 tablet by mouth every 6 (six) hours as needed for moderate pain. 04/12/19   Tarig Zimmers, Roselyn BeringSusan W, PA-C  indomethacin (INDOCIN) 25 MG capsule Take 25 mg by mouth 3 (three) times daily as needed.    [provider]  tamsulosin (FLOMAX) 0.4 MG CAPS capsule Take 1 capsule (0.4 mg total) by mouth daily. 08/23/14   Lovell SheehanJenkins,  Tinnie Gens, MD  traMADol (ULTRAM) 50 MG tablet Take 1 tablet (50 mg total) by mouth every 6 (six) hours as needed. 04/12/19   Faythe Ghee, PA-C    Allergies Patient has no known allergies.  Family History  Problem Relation Age of Onset  . COPD Mother   . Hypertension Mother   . Alcohol abuse Father   . COPD Father     Social History Social History   Tobacco Use  . Smoking status: Never Smoker  . Smokeless tobacco: Former Engineer, water Use Topics  . Alcohol use: No  . Drug use: No    Review of Systems   Constitutional: No fever/chills Eyes: No visual changes. ENT: No sore throat. Respiratory: Denies cough Gastrointestinal: Positive for abdominal swelling on the right side, denies vomiting or diarrhea Genitourinary: Negative for dysuria. Musculoskeletal: Positive for back pain with leg numbness. Skin: Negative for rash.    ____________________________________________   PHYSICAL EXAM:  VITAL SIGNS: ED Triage Vitals  Enc Vitals Group     BP 04/12/19 1019 (!) 122/91     Pulse Rate 04/12/19 1019 75     Resp 04/12/19 1019 17     Temp 04/12/19 1021 98.2 F (36.8 C)     Temp Source 04/12/19 1019 Oral     SpO2 04/12/19 1019 98 %     Weight 04/12/19 1020 225 lb (102.1 kg)     Height 04/12/19 1020 5\' 8"  (1.727 m)     Head Circumference --      Peak Flow --      Pain Score 04/12/19 1020 7     Pain Loc --      Pain Edu? --      Excl. in GC? --     Constitutional: Alert and oriented. Well appearing and in no acute distress. Eyes: Conjunctivae are normal.  Head: Atraumatic. Nose: No congestion/rhinnorhea. Mouth/Throat: Mucous membranes are moist.   Neck:  supple no lymphadenopathy noted Cardiovascular: Normal rate, regular rhythm. Heart sounds are normal Respiratory: Normal respiratory effort.  No retractions, lungs c t a  Abd: soft tender in right upper quadrant, masses noted along the right lateral aspect, the area is tender to palpation, ribs are tender to palpation, Bs normal all 4 quad GU: deferred Musculoskeletal: FROM all extremities, warm and well perfused, spine actually is nontender.  Tenderness noted at the right hip along the bursa Neurologic:  Normal speech and language.  Skin:  Skin is warm, dry and intact. No rash noted. Psychiatric: Mood and affect are normal. Speech and behavior are normal.  ____________________________________________   LABS (all labs ordered are listed, but only abnormal results are displayed)  Labs Reviewed  CBC WITH  DIFFERENTIAL/PLATELET - Abnormal; Notable for the following components:      Result Value   WBC 11.8 (*)    Lymphs Abs 4.3 (*)    Abs Immature Granulocytes 0.08 (*)    All other components within normal limits  COMPREHENSIVE METABOLIC PANEL - Abnormal; Notable for the following components:   Glucose, Bld 102 (*)    Calcium 8.4 (*)    All other components within normal limits  APTT  PROTIME-INR   ____________________________________________   ____________________________________________  RADIOLOGY  CT abdomen pelvis with IV contrast, chest CT with IV contrast is negative for any acute abnormality that explains patient's symptoms.  ____________________________________________   PROCEDURES  Procedure(s) performed: Tramadol 1 p.o.   Procedures    ____________________________________________   INITIAL IMPRESSION / ASSESSMENT AND  PLAN / ED COURSE  Pertinent labs & imaging results that were available during my care of the patient were reviewed by me and considered in my medical decision making (see chart for details).   63 year old male presents emergency department complaining of low back pain with leg numbness and abdominal pain and swelling on the right side.  Physical exam shows a large mass on the right mid upper mid to upper quadrant below the liver.  Spine is nontender.  Trochanter bursa of the right hip is tender.  CT chest/abdomen/pelvis with IV contrast ordered CBC, metabolic panel, PT and PTT ordered   CBC, metabolic panel, PT and PTT are all normal  CT does not show any acute abnormality.  Explained the CT results and lab results to the patient.  Explained to him this most likely is bowel that has protruded when he stands.  Due to the amount of pain at the area he should follow-up with surgery to see if there is possibly a hernia at this site.  He is to return to the emergency department if worsening.  He is also to follow-up with his spine doctor to discuss  the weakness in his legs.  He was given a prescription for tramadol to use during the day and Vicodin to use at night.  He was discharged in stable condition.  As part of my medical decision making, I reviewed the following data within the electronic MEDICAL RECORD NUMBER Nursing notes reviewed and incorporated, Labs reviewed see above, Old chart reviewed, Radiograph reviewed CT is negative for any acute abnormality, Notes from prior ED visits and [place] Controlled Substance Database  ____________________________________________   FINAL CLINICAL IMPRESSION(S) / ED DIAGNOSES  Final diagnoses:  Leg weakness, bilateral  Abdominal wall hernia      NEW MEDICATIONS STARTED DURING THIS VISIT:  Discharge Medication List as of 04/12/2019  2:55 PM    START taking these medications   Details  HYDROcodone-acetaminophen (NORCO/VICODIN) 5-325 MG tablet Take 1 tablet by mouth every 6 (six) hours as needed for moderate pain., Starting Mon 04/12/2019, Normal    traMADol (ULTRAM) 50 MG tablet Take 1 tablet (50 mg total) by mouth every 6 (six) hours as needed., Starting Mon 04/12/2019, Normal         Note:  This document was prepared using Dragon voice recognition software and may include unintentional dictation errors.    Faythe Ghee, PA-C 04/12/19 1814    Emily Filbert, MD 04/12/19 1945

## 2019-04-12 NOTE — ED Notes (Signed)
See triage note  Sent over from [facility] with back pain and bilateral leg pain   States he has had leg pain/numbness about 6 months  Also has a "mass" to right lateral abd   He noticed that area about 2 weeks ago

## 2019-04-12 NOTE — ED Triage Notes (Signed)
Pt c/o BL LE numbness and pain, pt has a hx of lower back surgery, states this has been going on for over 6 months, states he has been having issues with his back for 5 years with the numbness.

## 2019-04-12 NOTE — Discharge Instructions (Signed)
Follow-up with your regular doctor.  Follow-up with Dr. Maia Plan for hernia evaluation.  Follow-up with your regular surgeon for your back pain and leg weakness.  Take the tramadol during the day for pain as needed.  Vicodin at night for pain as needed.

## 2019-04-26 ENCOUNTER — Other Ambulatory Visit: Payer: Self-pay | Admitting: Student

## 2019-04-26 DIAGNOSIS — M4804 Spinal stenosis, thoracic region: Secondary | ICD-10-CM

## 2019-04-26 DIAGNOSIS — M48062 Spinal stenosis, lumbar region with neurogenic claudication: Secondary | ICD-10-CM

## 2019-04-27 ENCOUNTER — Ambulatory Visit
Admission: RE | Admit: 2019-04-27 | Discharge: 2019-04-27 | Disposition: A | Discharge status: Home / Self Care | Payer: Medicare HMO | Source: Ambulatory Visit | Attending: Student | Admitting: Student

## 2019-04-27 ENCOUNTER — Other Ambulatory Visit: Payer: Self-pay

## 2019-04-27 DIAGNOSIS — M4804 Spinal stenosis, thoracic region: Secondary | ICD-10-CM | POA: Insufficient documentation

## 2019-04-27 DIAGNOSIS — M48062 Spinal stenosis, lumbar region with neurogenic claudication: Secondary | ICD-10-CM | POA: Insufficient documentation

## 2019-04-27 MED ORDER — GADOBUTROL 1 MMOL/ML IV SOLN
10.0000 mL | Freq: Once | INTRAVENOUS | Status: AC | PRN
  Administered 2019-04-27: 15:00:00 10 mL via INTRAVENOUS

## 2019-05-04 ENCOUNTER — Other Ambulatory Visit: Payer: Self-pay | Admitting: Neurosurgery

## 2019-05-06 ENCOUNTER — Other Ambulatory Visit: Payer: Self-pay | Admitting: Neurosurgery

## 2019-05-08 ENCOUNTER — Other Ambulatory Visit (HOSPITAL_COMMUNITY)
Admission: RE | Admit: 2019-05-08 | Discharge: 2019-05-08 | Disposition: A | Discharge status: Home / Self Care | Payer: Medicare HMO | Source: Ambulatory Visit | Attending: Neurosurgery | Admitting: Neurosurgery

## 2019-05-08 DIAGNOSIS — Z1159 Encounter for screening for other viral diseases: Secondary | ICD-10-CM | POA: Insufficient documentation

## 2019-05-09 LAB — SARS CORONAVIRUS 2 (TAT 6-24 HRS)

## 2019-05-11 ENCOUNTER — Other Ambulatory Visit: Payer: Self-pay

## 2019-05-11 ENCOUNTER — Other Ambulatory Visit (HOSPITAL_COMMUNITY)
Admission: RE | Admit: 2019-05-11 | Discharge: 2019-05-11 | Disposition: A | Discharge status: Home / Self Care | Payer: Medicare HMO | Source: Ambulatory Visit | Attending: Neurosurgery | Admitting: Neurosurgery

## 2019-05-11 ENCOUNTER — Encounter (HOSPITAL_COMMUNITY): Payer: Self-pay | Admitting: *Deleted

## 2019-05-11 DIAGNOSIS — Z1159 Encounter for screening for other viral diseases: Secondary | ICD-10-CM | POA: Diagnosis present

## 2019-05-11 LAB — SARS CORONAVIRUS 2 (TAT 6-24 HRS): SARS Coronavirus 2: NEGATIVE

## 2019-05-11 NOTE — Progress Notes (Signed)
Mr Mehlhoff denies chest pain or shortness of breath.  PCP is Dr Atha Starks. Patient was tested for COVID today and is in quarantine at home.  Mr Sanderfer took Dilaudid today and he has began to itch, he has stopped taking it. Patient is now taking Tylenol 1000 mg - 1500mg , I informed patient that he needs to be careful that he does take over 400 mg a day.

## 2019-05-12 ENCOUNTER — Encounter (HOSPITAL_COMMUNITY): Payer: Self-pay | Admitting: Critical Care Medicine

## 2019-05-12 ENCOUNTER — Encounter (HOSPITAL_COMMUNITY)
Admission: RE | Disposition: A | Discharge status: Home / Self Care | Payer: Self-pay | Source: Home / Self Care | Attending: Neurosurgery

## 2019-05-12 ENCOUNTER — Ambulatory Visit (HOSPITAL_COMMUNITY)
Admission: RE | Admit: 2019-05-12 | Discharge: 2019-05-13 | Disposition: A | Discharge status: Home / Self Care | Payer: Medicare HMO | Attending: Neurosurgery | Admitting: Neurosurgery

## 2019-05-12 ENCOUNTER — Ambulatory Visit (HOSPITAL_COMMUNITY): Payer: Medicare HMO | Admitting: Critical Care Medicine

## 2019-05-12 ENCOUNTER — Ambulatory Visit (HOSPITAL_COMMUNITY): Payer: Medicare HMO

## 2019-05-12 DIAGNOSIS — J45909 Unspecified asthma, uncomplicated: Secondary | ICD-10-CM | POA: Diagnosis not present

## 2019-05-12 DIAGNOSIS — M4714 Other spondylosis with myelopathy, thoracic region: Secondary | ICD-10-CM | POA: Insufficient documentation

## 2019-05-12 DIAGNOSIS — Z885 Allergy status to narcotic agent status: Secondary | ICD-10-CM | POA: Diagnosis not present

## 2019-05-12 DIAGNOSIS — I10 Essential (primary) hypertension: Secondary | ICD-10-CM | POA: Diagnosis not present

## 2019-05-12 DIAGNOSIS — Z79899 Other long term (current) drug therapy: Secondary | ICD-10-CM | POA: Diagnosis not present

## 2019-05-12 DIAGNOSIS — Z87891 Personal history of nicotine dependence: Secondary | ICD-10-CM | POA: Diagnosis not present

## 2019-05-12 DIAGNOSIS — G8220 Paraplegia, unspecified: Secondary | ICD-10-CM | POA: Diagnosis not present

## 2019-05-12 DIAGNOSIS — Z981 Arthrodesis status: Secondary | ICD-10-CM | POA: Insufficient documentation

## 2019-05-12 DIAGNOSIS — M4804 Spinal stenosis, thoracic region: Secondary | ICD-10-CM | POA: Diagnosis present

## 2019-05-12 DIAGNOSIS — F419 Anxiety disorder, unspecified: Secondary | ICD-10-CM | POA: Diagnosis not present

## 2019-05-12 DIAGNOSIS — M1990 Unspecified osteoarthritis, unspecified site: Secondary | ICD-10-CM | POA: Diagnosis not present

## 2019-05-12 DIAGNOSIS — Z419 Encounter for procedure for purposes other than remedying health state, unspecified: Secondary | ICD-10-CM

## 2019-05-12 HISTORY — PX: THORACIC DISCECTOMY: SHX6113

## 2019-05-12 LAB — BASIC METABOLIC PANEL
Anion gap: 12 (ref 5–15)
BUN: 21 mg/dL (ref 8–23)
CO2: 23 mmol/L (ref 22–32)
Calcium: 9.2 mg/dL (ref 8.9–10.3)
Chloride: 105 mmol/L (ref 98–111)
Creatinine, Ser: 0.8 mg/dL (ref 0.61–1.24)
GFR calc Af Amer: >60 mL/min (ref >60)
GFR calc non Af Amer: >60 mL/min (ref >60)
Glucose, Bld: 114 mg/dL — ABNORMAL HIGH (ref 70–99)
Potassium: 4 mmol/L (ref 3.5–5.1)
Sodium: 140 mmol/L (ref 135–145)

## 2019-05-12 LAB — CBC
HCT: 45 % (ref 39.0–52.0)
Hemoglobin: 15 g/dL (ref 13.0–17.0)
MCH: 30.5 pg (ref 26.0–34.0)
MCHC: 33.3 g/dL (ref 30.0–36.0)
MCV: 91.5 fL (ref 80.0–100.0)
Platelets: 232 10*3/uL (ref 150–400)
RBC: 4.92 MIL/uL (ref 4.22–5.81)
RDW: 13.3 % (ref 11.5–15.5)
WBC: 11.4 10*3/uL — ABNORMAL HIGH (ref 4.0–10.5)
nRBC: 0 % (ref 0.0–0.2)

## 2019-05-12 SURGERY — THORACIC DISCECTOMY
Anesthesia: General | Site: Spine Thoracic

## 2019-05-12 MED ORDER — BISACODYL 10 MG RE SUPP: 10.0000 mg | Freq: Every day | RECTAL | Status: DC | PRN

## 2019-05-12 MED ORDER — CEFAZOLIN SODIUM-DEXTROSE 2-4 GM/100ML-% IV SOLN
2.0000 g | INTRAVENOUS | Status: AC
  Administered 2019-05-12: 12:00:00 2 g via INTRAVENOUS
  Filled 2019-05-12: qty 100

## 2019-05-12 MED ORDER — ONDANSETRON HCL 4 MG/2ML IJ SOLN: 4.0000 mg | Freq: Four times a day (QID) | INTRAMUSCULAR | Status: DC | PRN

## 2019-05-12 MED ORDER — PROPOFOL 10 MG/ML IV BOLUS
INTRAVENOUS | Status: DC | PRN
  Administered 2019-05-12: 200 mg via INTRAVENOUS

## 2019-05-12 MED ORDER — MIDAZOLAM HCL 2 MG/2ML IJ SOLN
INTRAMUSCULAR | Status: AC
  Filled 2019-05-12: qty 2

## 2019-05-12 MED ORDER — CHLORHEXIDINE GLUCONATE CLOTH 2 % EX PADS: 6.0000 | MEDICATED_PAD | Freq: Once | CUTANEOUS | Status: DC

## 2019-05-12 MED ORDER — CEFAZOLIN SODIUM-DEXTROSE 2-4 GM/100ML-% IV SOLN
2.0000 g | Freq: Three times a day (TID) | INTRAVENOUS | Status: AC
  Administered 2019-05-12 – 2019-05-13 (×2): 2 g via INTRAVENOUS
  Filled 2019-05-12 (×2): qty 100

## 2019-05-12 MED ORDER — MIDAZOLAM HCL 5 MG/5ML IJ SOLN
INTRAMUSCULAR | Status: DC | PRN
  Administered 2019-05-12: 2 mg via INTRAVENOUS

## 2019-05-12 MED ORDER — THROMBIN 5000 UNITS EX SOLR
OROMUCOSAL | Status: DC | PRN
  Administered 2019-05-12: 13:00:00 5 mL

## 2019-05-12 MED ORDER — ACETAMINOPHEN 325 MG PO TABS: 650.0000 mg | ORAL_TABLET | ORAL | Status: DC | PRN

## 2019-05-12 MED ORDER — ROCURONIUM BROMIDE 10 MG/ML (PF) SYRINGE
PREFILLED_SYRINGE | INTRAVENOUS | Status: DC | PRN
  Administered 2019-05-12: 20 mg via INTRAVENOUS
  Administered 2019-05-12: 50 mg via INTRAVENOUS

## 2019-05-12 MED ORDER — SODIUM CHLORIDE 0.9 % IV SOLN
INTRAVENOUS | Status: DC | PRN
  Administered 2019-05-12: 13:00:00 500 mL

## 2019-05-12 MED ORDER — CYCLOBENZAPRINE HCL 10 MG PO TABS: 10.0000 mg | ORAL_TABLET | Freq: Three times a day (TID) | ORAL | Status: DC | PRN

## 2019-05-12 MED ORDER — FENTANYL CITRATE (PF) 100 MCG/2ML IJ SOLN: 25.0000 ug | INTRAMUSCULAR | Status: DC | PRN

## 2019-05-12 MED ORDER — DEXAMETHASONE SODIUM PHOSPHATE 10 MG/ML IJ SOLN
INTRAMUSCULAR | Status: DC | PRN
  Administered 2019-05-12: 4 mg via INTRAVENOUS

## 2019-05-12 MED ORDER — HYDROMORPHONE HCL 2 MG PO TABS
4.0000 mg | ORAL_TABLET | ORAL | Status: DC | PRN
  Administered 2019-05-12 – 2019-05-13 (×4): 4 mg via ORAL
  Filled 2019-05-12 (×4): qty 2

## 2019-05-12 MED ORDER — ONDANSETRON HCL 4 MG/2ML IJ SOLN
INTRAMUSCULAR | Status: DC | PRN
  Administered 2019-05-12: 4 mg via INTRAVENOUS

## 2019-05-12 MED ORDER — MENTHOL 3 MG MT LOZG: 1.0000 | LOZENGE | OROMUCOSAL | Status: DC | PRN

## 2019-05-12 MED ORDER — ONDANSETRON HCL 4 MG PO TABS: 4.0000 mg | ORAL_TABLET | Freq: Four times a day (QID) | ORAL | Status: DC | PRN

## 2019-05-12 MED ORDER — BACITRACIN ZINC 500 UNIT/GM EX OINT
TOPICAL_OINTMENT | CUTANEOUS | Status: DC | PRN
  Administered 2019-05-12: 1 via TOPICAL

## 2019-05-12 MED ORDER — ALBUTEROL SULFATE (2.5 MG/3ML) 0.083% IN NEBU: 3.0000 mL | INHALATION_SOLUTION | Freq: Four times a day (QID) | RESPIRATORY_TRACT | Status: DC | PRN

## 2019-05-12 MED ORDER — BUPIVACAINE-EPINEPHRINE (PF) 0.5% -1:200000 IJ SOLN
INTRAMUSCULAR | Status: DC | PRN
  Administered 2019-05-12: 10 mL

## 2019-05-12 MED ORDER — INDOMETHACIN 25 MG PO CAPS: 25.0000 mg | ORAL_CAPSULE | Freq: Two times a day (BID) | ORAL | Status: DC | PRN

## 2019-05-12 MED ORDER — ACETAMINOPHEN 500 MG PO TABS
1000.0000 mg | ORAL_TABLET | Freq: Four times a day (QID) | ORAL | Status: DC
  Administered 2019-05-12 – 2019-05-13 (×3): 1000 mg via ORAL
  Filled 2019-05-12 (×3): qty 2

## 2019-05-12 MED ORDER — ACETAMINOPHEN 650 MG RE SUPP: 650.0000 mg | RECTAL | Status: DC | PRN

## 2019-05-12 MED ORDER — ATORVASTATIN CALCIUM 10 MG PO TABS
20.0000 mg | ORAL_TABLET | Freq: Every day | ORAL | Status: DC
  Administered 2019-05-12: 20 mg via ORAL
  Filled 2019-05-12: qty 2

## 2019-05-12 MED ORDER — AMLODIPINE BESYLATE 5 MG PO TABS: 5.0000 mg | ORAL_TABLET | Freq: Every day | ORAL | Status: DC

## 2019-05-12 MED ORDER — SODIUM CHLORIDE 0.9 % IV SOLN: 250.0000 mL | INTRAVENOUS | Status: DC

## 2019-05-12 MED ORDER — FENTANYL CITRATE (PF) 250 MCG/5ML IJ SOLN
INTRAMUSCULAR | Status: AC
  Filled 2019-05-12: qty 5

## 2019-05-12 MED ORDER — BUPIVACAINE-EPINEPHRINE (PF) 0.5% -1:200000 IJ SOLN
INTRAMUSCULAR | Status: AC
  Filled 2019-05-12: qty 30

## 2019-05-12 MED ORDER — MORPHINE SULFATE (PF) 4 MG/ML IV SOLN
4.0000 mg | INTRAVENOUS | Status: DC | PRN
  Administered 2019-05-12: 4 mg via INTRAVENOUS
  Filled 2019-05-12: qty 1

## 2019-05-12 MED ORDER — SODIUM CHLORIDE 0.9% FLUSH: 3.0000 mL | INTRAVENOUS | Status: DC | PRN

## 2019-05-12 MED ORDER — LIDOCAINE 2% (20 MG/ML) 5 ML SYRINGE
INTRAMUSCULAR | Status: DC | PRN
  Administered 2019-05-12: 50 mg via INTRAVENOUS

## 2019-05-12 MED ORDER — SODIUM CHLORIDE 0.9 % IV SOLN
INTRAVENOUS | Status: DC | PRN
  Administered 2019-05-12: 12:00:00 25 ug/min via INTRAVENOUS

## 2019-05-12 MED ORDER — BACITRACIN ZINC 500 UNIT/GM EX OINT
TOPICAL_OINTMENT | CUTANEOUS | Status: AC
  Filled 2019-05-12: qty 28.35

## 2019-05-12 MED ORDER — SUGAMMADEX SODIUM 200 MG/2ML IV SOLN
INTRAVENOUS | Status: DC | PRN
  Administered 2019-05-12: 200 mg via INTRAVENOUS

## 2019-05-12 MED ORDER — SUCCINYLCHOLINE CHLORIDE 200 MG/10ML IV SOSY
PREFILLED_SYRINGE | INTRAVENOUS | Status: DC | PRN
  Administered 2019-05-12: 120 mg via INTRAVENOUS

## 2019-05-12 MED ORDER — THROMBIN 5000 UNITS EX SOLR
CUTANEOUS | Status: AC
  Filled 2019-05-12: qty 15000

## 2019-05-12 MED ORDER — PHENOL 1.4 % MT LIQD: 1.0000 | OROMUCOSAL | Status: DC | PRN

## 2019-05-12 MED ORDER — DULOXETINE HCL 30 MG PO CPEP: 60.0000 mg | ORAL_CAPSULE | Freq: Every day | ORAL | Status: DC

## 2019-05-12 MED ORDER — OXYCODONE HCL 5 MG PO TABS: 5.0000 mg | ORAL_TABLET | Freq: Once | ORAL | Status: DC | PRN

## 2019-05-12 MED ORDER — BENAZEPRIL HCL 5 MG PO TABS
5.0000 mg | ORAL_TABLET | Freq: Every day | ORAL | Status: DC
  Administered 2019-05-12: 5 mg via ORAL
  Filled 2019-05-12 (×2): qty 1

## 2019-05-12 MED ORDER — 0.9 % SODIUM CHLORIDE (POUR BTL) OPTIME
TOPICAL | Status: DC | PRN
  Administered 2019-05-12: 1000 mL

## 2019-05-12 MED ORDER — SODIUM CHLORIDE 0.9% FLUSH
3.0000 mL | Freq: Two times a day (BID) | INTRAVENOUS | Status: DC
  Administered 2019-05-12: 3 mL via INTRAVENOUS

## 2019-05-12 MED ORDER — MONTELUKAST SODIUM 10 MG PO TABS
10.0000 mg | ORAL_TABLET | Freq: Every day | ORAL | Status: DC
  Administered 2019-05-12: 10 mg via ORAL
  Filled 2019-05-12 (×2): qty 1

## 2019-05-12 MED ORDER — PROPOFOL 10 MG/ML IV BOLUS
INTRAVENOUS | Status: AC
  Filled 2019-05-12: qty 20

## 2019-05-12 MED ORDER — LACTATED RINGERS IV SOLN
INTRAVENOUS | Status: DC
  Administered 2019-05-12: 09:00:00 via INTRAVENOUS

## 2019-05-12 MED ORDER — FENTANYL CITRATE (PF) 250 MCG/5ML IJ SOLN
INTRAMUSCULAR | Status: DC | PRN
  Administered 2019-05-12: 75 ug via INTRAVENOUS
  Administered 2019-05-12: 25 ug via INTRAVENOUS
  Administered 2019-05-12: 50 ug via INTRAVENOUS

## 2019-05-12 MED ORDER — OXYCODONE HCL 5 MG/5ML PO SOLN: 5.0000 mg | Freq: Once | ORAL | Status: DC | PRN

## 2019-05-12 MED ORDER — DOCUSATE SODIUM 100 MG PO CAPS
100.0000 mg | ORAL_CAPSULE | Freq: Two times a day (BID) | ORAL | Status: DC
  Administered 2019-05-12: 100 mg via ORAL
  Filled 2019-05-12: qty 1

## 2019-05-12 SURGICAL SUPPLY — 62 items
BAG DECANTER FOR FLEXI CONT (MISCELLANEOUS) ×3 IMPLANT
BENZOIN TINCTURE PRP APPL 2/3 (GAUZE/BANDAGES/DRESSINGS) ×3 IMPLANT
BIT DRILL NEURO 2X3.1 SFT TUCH (MISCELLANEOUS) ×1 IMPLANT
BLADE CLIPPER SURG (BLADE) IMPLANT
BUR MATCHSTICK NEURO 3.0 LAGG (BURR) ×3 IMPLANT
BUR PRECISION FLUTE 6.0 (BURR) ×3 IMPLANT
CANISTER SUCT 3000ML PPV (MISCELLANEOUS) ×3 IMPLANT
CARTRIDGE OIL MAESTRO DRILL (MISCELLANEOUS) ×1 IMPLANT
CLOSURE WOUND 1/2 X4 (GAUZE/BANDAGES/DRESSINGS) ×1
COVER WAND RF STERILE (DRAPES) IMPLANT
DERMABOND ADVANCED (GAUZE/BANDAGES/DRESSINGS) ×2
DERMABOND ADVANCED .7 DNX12 (GAUZE/BANDAGES/DRESSINGS) ×1 IMPLANT
DIFFUSER DRILL AIR PNEUMATIC (MISCELLANEOUS) ×3 IMPLANT
DRAPE LAPAROTOMY 100X72X124 (DRAPES) ×3 IMPLANT
DRAPE MICROSCOPE LEICA (MISCELLANEOUS) IMPLANT
DRAPE POUCH INSTRU U-SHP 10X18 (DRAPES) ×3 IMPLANT
DRAPE SURG 17X23 STRL (DRAPES) ×12 IMPLANT
DRILL NEURO 2X3.1 SOFT TOUCH (MISCELLANEOUS) ×3
DRSG OPSITE POSTOP 3X4 (GAUZE/BANDAGES/DRESSINGS) ×3 IMPLANT
ELECT BLADE 4.0 EZ CLEAN MEGAD (MISCELLANEOUS) ×3
ELECT REM PT RETURN 9FT ADLT (ELECTROSURGICAL) ×3
ELECTRODE BLDE 4.0 EZ CLN MEGD (MISCELLANEOUS) ×1 IMPLANT
ELECTRODE REM PT RTRN 9FT ADLT (ELECTROSURGICAL) ×1 IMPLANT
EVACUATOR 1/8 PVC DRAIN (DRAIN) ×3 IMPLANT
GAUZE 4X4 16PLY RFD (DISPOSABLE) IMPLANT
GAUZE SPONGE 4X4 12PLY STRL (GAUZE/BANDAGES/DRESSINGS) IMPLANT
GLOVE BIO SURGEON STRL SZ 6.5 (GLOVE) ×10 IMPLANT
GLOVE BIO SURGEON STRL SZ7.5 (GLOVE) ×3 IMPLANT
GLOVE BIO SURGEON STRL SZ8 (GLOVE) ×3 IMPLANT
GLOVE BIO SURGEON STRL SZ8.5 (GLOVE) ×3 IMPLANT
GLOVE BIO SURGEONS STRL SZ 6.5 (GLOVE) ×5
GLOVE BIOGEL PI IND STRL 6.5 (GLOVE) ×3 IMPLANT
GLOVE BIOGEL PI IND STRL 7.5 (GLOVE) ×1 IMPLANT
GLOVE BIOGEL PI IND STRL 8 (GLOVE) ×1 IMPLANT
GLOVE BIOGEL PI INDICATOR 6.5 (GLOVE) ×6
GLOVE BIOGEL PI INDICATOR 7.5 (GLOVE) ×2
GLOVE BIOGEL PI INDICATOR 8 (GLOVE) ×2
GLOVE ECLIPSE 7.5 STRL STRAW (GLOVE) ×6 IMPLANT
GLOVE EXAM NITRILE XL STR (GLOVE) IMPLANT
GOWN STRL REUS W/ TWL LRG LVL3 (GOWN DISPOSABLE) ×4 IMPLANT
GOWN STRL REUS W/ TWL XL LVL3 (GOWN DISPOSABLE) ×2 IMPLANT
GOWN STRL REUS W/TWL 2XL LVL3 (GOWN DISPOSABLE) IMPLANT
GOWN STRL REUS W/TWL LRG LVL3 (GOWN DISPOSABLE) ×8
GOWN STRL REUS W/TWL XL LVL3 (GOWN DISPOSABLE) ×4
KIT BASIN OR (CUSTOM PROCEDURE TRAY) ×3 IMPLANT
KIT TURNOVER KIT B (KITS) ×3 IMPLANT
NEEDLE HYPO 21X1.5 SAFETY (NEEDLE) IMPLANT
NEEDLE HYPO 22GX1.5 SAFETY (NEEDLE) ×3 IMPLANT
NS IRRIG 1000ML POUR BTL (IV SOLUTION) ×3 IMPLANT
OIL CARTRIDGE MAESTRO DRILL (MISCELLANEOUS) ×3
PACK LAMINECTOMY NEURO (CUSTOM PROCEDURE TRAY) ×3 IMPLANT
PAD ARMBOARD 7.5X6 YLW CONV (MISCELLANEOUS) ×15 IMPLANT
PATTIES SURGICAL .5 X1 (DISPOSABLE) IMPLANT
RUBBERBAND STERILE (MISCELLANEOUS) IMPLANT
SPONGE SURGIFOAM ABS GEL SZ50 (HEMOSTASIS) IMPLANT
STRIP CLOSURE SKIN 1/2X4 (GAUZE/BANDAGES/DRESSINGS) ×2 IMPLANT
SUT VIC AB 1 CT1 18XBRD ANBCTR (SUTURE) ×1 IMPLANT
SUT VIC AB 1 CT1 8-18 (SUTURE) ×2
SUT VIC AB 2-0 CP2 18 (SUTURE) ×6 IMPLANT
TOWEL GREEN STERILE (TOWEL DISPOSABLE) ×3 IMPLANT
TOWEL GREEN STERILE FF (TOWEL DISPOSABLE) ×3 IMPLANT
WATER STERILE IRR 1000ML POUR (IV SOLUTION) ×3 IMPLANT

## 2019-05-12 NOTE — Op Note (Signed)
Brief history: The patient is a 63 year old white male on whom I previously performed an L2-3, L3-4, L4-5 and L5-S1 fusion.  Initially did well.  More recently has developed increasing back and bilateral leg pain and weakness.  He has failed medical management.  He was worked up with a lumbar and a thoracic MRI which demonstrated significant thoracic stenosis at T10-11.  I discussed the various treatment options with the patient.  I recommend he proceed with a thoracic laminectomy.  The patient has weighed the risks, benefits and alternatives of surgery and decided to proceed with the operation.  Preop diagnosis: Thoracic spinal stenosis, thoracic myelopathy, thoracic spine pain, paraparesis  Postop diagnosis: The same  Procedure: T10-11 laminectomy using microdissection  Surgeon: Dr. Earle Gell  Assistant: Deatra Ina, MD, and Arnetha Massy nurse practitioner  Anesthesia: General tracheal  Estimated blood loss: 100 cc  Specimens: None  Drains: One epidural Hemovac  Complications: None  Description of procedure: The patient was brought to the operating room by the anesthesia team.  General endotracheal anesthesia was induced.  The patient was turned to the prone position on the Wilson frame.  His lumbosacral region was then prepared with Betadine scrub and Betadine solution.  Sterile drapes were applied.  I then injected the area to be incised with Marcaine with epinephrine solution.  I used the scalpel to make a linear midline incision over the T10-11 interspace.  I used electrocautery to perform bilateral subperiosteal dissection exposing the spinous process and lamina of T10 and T11.  We obtained intraoperative radiograph to confirm our location.  We inserted the [facility] retractor for exposure.  We began the decompression by incising the interspinous ligament at T9-10 and T10-11.  We then used a Leksell rongeur to remove the spinous process at T10.  I used a high-speed drill to  create bilateral T10 laminotomies.  We completed the T10 laminectomy using the Kerrison punches.  We removed the ligamentum flavum at T9-10 and T10-11.  We remove the cephalad aspect of the T11 lamina.  We encountered quite a bit of hypertrophy of the ligamentum flavum.  The ligamentum flavum was adherent to the dura.  We brought the operative microscope into the field and under its medication and illumination we completed the decompression/microdissection.  We freed up the ligamentum flavum from the dura and removed it with the Kerrison punches and the pituitary forceps.  We got a good decompression of the thecal sac/spinal cord.  We then obtained hemostasis using bipolar cautery.  We irrigated the wound out with bacitracin ointment.  We placed a medium Hemovac drain in the epidural space and tunneled it out through a separate stab wound.  We then removed the retractors and reapproximated patient's thoracic fascia with interrupted 0 Vicryl suture.  We reapproximated the subcutaneous tissue with interrupted 2-0 Vicryl suture.  We then reapproximated skin with Steri-Strips and benzoin.  The wound was then coated with bacitracin ointment.  A sterile dressing was applied.  The drapes were removed.  By report all sponge, instrument, and needle counts were correct at the end of this case.

## 2019-05-12 NOTE — H&P (Signed)
Subjective: The patient is a 63 year old white male on whom I performed a lumbar fusion years ago.  He initially did well but has developed paraparesis and lower thoracic numbness.  He was worked up with a lumbar and thoracic MRI which demonstrated mild stenosis at T10-11.  I discussed the various treatment options with the patient and recommended surgery.  He has weighed the risks, benefits and alternatives surgery and decided proceed with a T10-11 laminectomy.  Past Medical History:  Diagnosis Date  . Arthritis   . Asthma   . Back pain   . Hypertension     Past Surgical History:  Procedure Laterality Date  . BACK SURGERY  Nov 2014   [facility]  . COLONOSCOPY    . HERNIA REPAIR     as a child; ingunial right  . POSTERIOR LUMBAR FUSION 4 LEVEL N/A 08/17/2014   Procedure: Lumbar two-three, Lumbar three-four, Lumbar four-five, Lumbar five-Sacral one Laminectomy and  Posterior Lumbar Interbody Fusion ;  Surgeon: Tressie StalkerJeffrey Jameka Ivie, MD;  Location: [facility];  Service: Neurosurgery;  Laterality: N/A;  Lumbar two-three, Lumbar three-four, Lumbar four-five, Lumbar five-Sacral one Laminectomy and  Posterior Lumbar Interbody Fusion     Allergies  Allergen Reactions  . Hydromorphone Itching  . Hydrocodone Itching    Social History   Tobacco Use  . Smoking status: Never Smoker  . Smokeless tobacco: Former NeurosurgeonUser    Types: Chew  . Tobacco comment: Quit chewing 2015  Substance Use Topics  . Alcohol use: No    Family History  Problem Relation Age of Onset  . COPD Mother   . Hypertension Mother   . Alcohol abuse Father   . COPD Father    Prior to Admission medications   Medication Sig Start Date End Date Taking? Authorizing Provider  acetaminophen (TYLENOL) 500 MG tablet Take 1,000 mg by mouth every 6 (six) hours as needed.   Yes [provider]  albuterol (PROAIR HFA) 108 (90 BASE) MCG/ACT inhaler Inhale 1-2 puffs into the lungs every 6 (six) hours as needed for shortness of breath.     Yes [provider]  amLODipine (NORVASC) 5 MG tablet Take 5 mg by mouth daily.    Yes [provider]  atorvastatin (LIPITOR) 20 MG tablet Take 20 mg by mouth daily.   Yes [provider]  benazepril (LOTENSIN) 5 MG tablet Take 5 mg by mouth daily.    Yes [provider]  DULoxetine (CYMBALTA) 60 MG capsule Take 60 mg by mouth daily.  10/29/13  Yes [provider]  HYDROmorphone (DILAUDID) 2 MG tablet Take 2 mg by mouth every 4 (four) hours as needed for severe pain.   Yes [provider]  indomethacin (INDOCIN) 25 MG capsule Take 25 mg by mouth 2 (two) times daily as needed for moderate pain.    Yes [provider]  montelukast (SINGULAIR) 10 MG tablet Take 10 mg by mouth at bedtime.   Yes [provider]  pyridOXINE (VITAMIN B-6) 100 MG tablet Take 100 mg by mouth daily.   Yes [provider]  HYDROcodone-acetaminophen (NORCO/VICODIN) 5-325 MG tablet Take 1 tablet by mouth every 6 (six) hours as needed for moderate pain. Patient not taking: Reported on 05/05/2019 04/12/19   Faythe GheeFisher, Susan W, PA-C  traMADol (ULTRAM) 50 MG tablet Take 1 tablet (50 mg total) by mouth every 6 (six) hours as needed. Patient not taking: Reported on 05/05/2019 04/12/19   Faythe GheeFisher, Susan W, PA-C  Review of Systems  Positive ROS: As above  All other systems have been reviewed and were otherwise negative with the exception of those mentioned in the HPI and as above.  Objective: Vital signs in last 24 hours: Pulse Rate:  [72] 72 (07/01 0707) Resp:  [20] 20 (07/01 0707) BP: (127)/(78) 127/78 (07/01 0707) SpO2:  [98 %] 98 % (07/01 0707) Weight:  [99.8 kg] 99.8 kg (07/01 0707) Estimated body mass index is 33.45 kg/m as calculated from the following:   Height as of this encounter: 5\' 8"  (1.727 m).   Weight as of this encounter: 99.8 kg.   General Appearance: Alert Head: Normocephalic, without obvious abnormality, atraumatic Eyes:  PERRL, conjunctiva/corneas clear, EOM's intact,    Ears: Normal  Throat: Normal  Neck: Supple, Back: unremarkable Lungs: Clear to auscultation bilaterally, respirations unlabored Heart: Regular rate and rhythm, no murmur, rub or gallop Abdomen: Soft, non-tender Extremities: Extremities normal, atraumatic, no cyanosis or edema Skin: unremarkable  NEUROLOGIC:   Mental status: alert and oriented,Motor Exam -the patient is paraparetic. Sensory Exam -he has numbness approximately below the T10 dermatome down.  Reflexes:  Coordination - grossly normal Gait -unsteady Balance -unsteady Cranial Nerves: I: smell Not tested  II: visual acuity  OS: Normal  OD: Normal   II: visual fields Full to confrontation  II: pupils Equal, round, reactive to light  III,VII: ptosis None  III,IV,VI: extraocular muscles  Full ROM  V: mastication Normal  V: facial light touch sensation  Normal  V,VII: corneal reflex  Present  VII: facial muscle function - upper  Normal  VII: facial muscle function - lower Normal  VIII: hearing Not tested  IX: soft palate elevation  Normal  IX,X: gag reflex Present  XI: trapezius strength  5/5  XI: sternocleidomastoid strength 5/5  XI: neck flexion strength  5/5  XII: tongue strength  Normal    Data Review Lab Results  Component Value Date   WBC 11.4 (H) 05/12/2019   HGB 15.0 05/12/2019   HCT 45.0 05/12/2019   MCV 91.5 05/12/2019   PLT 232 05/12/2019   Lab Results  Component Value Date   NA 140 05/12/2019   K 4.0 05/12/2019   CL 105 05/12/2019   CO2 23 05/12/2019   BUN 21 05/12/2019   CREATININE 0.80 05/12/2019   GLUCOSE 114 (H) 05/12/2019   Lab Results  Component Value Date   INR 1.0 04/12/2019    Assessment/Plan: 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  We have discussed the various treatment options including  surgery.  I have recommended a T10-11 laminectomy.  I have shown him surgical models.  I have given him a surgical pamphlet.  We have discussed the risks, benefits, alternatives, expected postop course, and likelihood of achieving our goals with surgery.  I have answered all his questions.  He has decided to proceed with surgery.   Ophelia Charter 05/12/2019 11:20 AM

## 2019-05-12 NOTE — Transfer of Care (Signed)
Immediate Anesthesia Transfer of Care Note  Patient: Christopher Hester  Procedure(s) Performed: LAMINECTOMY THORACIC 10- THORACIC 11 (N/A Spine Thoracic)  Patient Location: PACU  Anesthesia Type:General  Level of Consciousness: awake, alert , oriented and patient cooperative  Airway & Oxygen Therapy: Patient Spontanous Breathing and Patient connected to face mask oxygen  Post-op Assessment: Report given to RN and Post -op Vital signs reviewed and stable  Post vital signs: Reviewed and stable  Last Vitals:  Vitals Value Taken Time  BP 149/94 05/12/19 1340  Temp    Pulse 79 05/12/19 1342  Resp 14 05/12/19 1342  SpO2 100 % 05/12/19 1342  Vitals shown include unvalidated device data.  Last Pain:  Vitals:   05/12/19 0727  PainSc: 8       Patients Stated Pain Goal: 3 (16/60/60 0459)  Complications: No apparent anesthesia complications

## 2019-05-12 NOTE — Anesthesia Procedure Notes (Signed)
Procedure Name: Intubation Date/Time: 05/12/2019 11:35 AM Performed by: Wilburn Cornelia, CRNA Pre-anesthesia Checklist: Patient identified, Emergency Drugs available, Suction available, Patient being monitored and Timeout performed Patient Re-evaluated:Patient Re-evaluated prior to induction Preoxygenation: Pre-oxygenation with 100% oxygen Induction Type: IV induction and Rapid sequence Laryngoscope Size: Mac and 4 Grade View: Grade III Tube type: Oral Tube size: 7.5 mm Number of attempts: 1 Airway Equipment and Method: Stylet Placement Confirmation: ETT inserted through vocal cords under direct vision,  positive ETCO2,  CO2 detector and breath sounds checked- equal and bilateral Secured at: 23 cm Tube secured with: Tape Dental Injury: Teeth and Oropharynx as per pre-operative assessment

## 2019-05-12 NOTE — Anesthesia Preprocedure Evaluation (Signed)
Anesthesia Evaluation  Patient identified by MRN, date of birth, ID band Patient awake    Reviewed: Allergy & Precautions, H&P , NPO status , Patient's Chart, lab work & pertinent test results  Airway Mallampati: II   Neck ROM: full    Dental   Pulmonary asthma ,    breath sounds clear to auscultation       Cardiovascular hypertension,  Rhythm:regular Rate:Normal     Neuro/Psych PSYCHIATRIC DISORDERS Anxiety Thoracic spinal stenosis    GI/Hepatic   Endo/Other    Renal/GU      Musculoskeletal  (+) Arthritis ,   Abdominal   Peds  Hematology   Anesthesia Other Findings   Reproductive/Obstetrics                             Anesthesia Physical Anesthesia Plan  ASA: II  Anesthesia Plan: General   Post-op Pain Management:    Induction: Intravenous  PONV Risk Score and Plan: 2 and Ondansetron, Dexamethasone, Midazolam and Treatment may vary due to age or medical condition  Airway Management Planned: Oral ETT  Additional Equipment:   Intra-op Plan:   Post-operative Plan: Extubation in OR  Informed Consent: I have reviewed the patients History and Physical, chart, labs and discussed the procedure including the risks, benefits and alternatives for the proposed anesthesia with the patient or authorized representative who has indicated his/her understanding and acceptance.       Plan Discussed with: CRNA, Anesthesiologist and Surgeon  Anesthesia Plan Comments:         Anesthesia Quick Evaluation

## 2019-05-12 NOTE — Progress Notes (Signed)
Patient repositioned in bed and given phone to reach out to friend.

## 2019-05-13 ENCOUNTER — Encounter (HOSPITAL_COMMUNITY): Payer: Self-pay | Admitting: Neurosurgery

## 2019-05-13 DIAGNOSIS — M4804 Spinal stenosis, thoracic region: Secondary | ICD-10-CM | POA: Diagnosis not present

## 2019-05-13 MED ORDER — HYDROXYZINE HCL 25 MG PO TABS
25.0000 mg | ORAL_TABLET | Freq: Four times a day (QID) | ORAL | Status: DC | PRN
  Administered 2019-05-13: 50 mg via ORAL
  Filled 2019-05-13: qty 2

## 2019-05-13 MED ORDER — HYDROMORPHONE HCL 4 MG PO TABS: 4.0000 mg | ORAL_TABLET | ORAL | 0 refills | Status: AC | PRN

## 2019-05-13 MED ORDER — DOCUSATE SODIUM 100 MG PO CAPS: 100.0000 mg | ORAL_CAPSULE | Freq: Two times a day (BID) | ORAL | 0 refills | Status: AC

## 2019-05-13 MED ORDER — CYCLOBENZAPRINE HCL 10 MG PO TABS: 10.0000 mg | ORAL_TABLET | Freq: Three times a day (TID) | ORAL | 0 refills | Status: AC | PRN

## 2019-05-13 NOTE — Discharge Summary (Signed)
Physician Discharge Summary  Patient ID: Christopher Hester MRN: [id number] DOB/AGE: 02/07/1956 63 y.o.  Admit date: 05/12/2019 Discharge date: 05/13/2019  Admission Diagnoses: Thoracic spinal stenosis, thoracic spine pain, thoracic myelopathy, paraparesis  Discharge Diagnoses: The same Active Problems:   Thoracic spinal stenosis   Discharged Condition: good  Hospital Course: I performed a T10-11 laminectomy on the patient on 05/12/2019.  The surgery went well.  The patient's postoperative course was unremarkable.  On postoperative day #1 he felt much stronger and requested discharge home.  He was given written and oral discharge instructions.  All his questions were answered.  Consults: Physical therapy, Occupational Therapy, care management Significant Diagnostic Studies: None Treatments: T10-11 laminectomy using microdissection Discharge Exam: Blood pressure 106/65, pulse 68, temperature 97.7 F (36.5 C), temperature source Oral, resp. rate 16, height 5\' 8"  (1.727 m), weight 99.8 kg, SpO2 99 %. The patient is alert and pleasant.  His dressing is clean and dry.  His lower extremity strength is grossly normal.  Disposition: Home  Discharge Instructions    Call MD for:  difficulty breathing, headache or visual disturbances   Complete by: As directed    Call MD for:  extreme fatigue   Complete by: As directed    Call MD for:  hives   Complete by: As directed    Call MD for:  persistant dizziness or light-headedness   Complete by: As directed    Call MD for:  persistant nausea and vomiting   Complete by: As directed    Call MD for:  redness, tenderness, or signs of infection (pain, swelling, redness, odor or green/yellow discharge around incision site)   Complete by: As directed    Call MD for:  severe uncontrolled pain   Complete by: As directed    Call MD for:  temperature >100.4   Complete by: As directed    Diet - low sodium heart healthy   Complete by: As directed    Discharge instructions   Complete by: As directed    Call [phone] for a followup appointment. Take a stool softener while you are using pain medications.   Driving Restrictions   Complete by: As directed    Do not drive for 2 weeks.   Increase activity slowly   Complete by: As directed    Lifting restrictions   Complete by: As directed    Do not lift more than 5 pounds. No excessive bending or twisting.   May shower / Bathe   Complete by: As directed    Remove the dressing for 3 days after surgery.  You may shower, but leave the incision alone.   Remove dressing in 48 hours   Complete by: As directed    Your stitches are under the scan and will dissolve by themselves. The Steri-Strips will fall off after you take a few showers. Do not rub back or pick at the wound, Leave the wound alone.     Allergies as of 05/13/2019      Reactions   Hydromorphone Itching   Hydrocodone Itching      Medication List    STOP taking these medications   HYDROcodone-acetaminophen 5-325 MG tablet Commonly known as: NORCO/VICODIN   traMADol 50 MG tablet Commonly known as: ULTRAM     TAKE these medications   acetaminophen 500 MG tablet Commonly known as: TYLENOL Take 1,000 mg by mouth every 6 (six) hours as needed.   amLODipine 5 MG tablet Commonly known as: NORVASC Take 5 mg  by mouth daily.   atorvastatin 20 MG tablet Commonly known as: LIPITOR Take 20 mg by mouth daily.   benazepril 5 MG tablet Commonly known as: LOTENSIN Take 5 mg by mouth daily.   cyclobenzaprine 10 MG tablet Commonly known as: FLEXERIL Take 1 tablet (10 mg total) by mouth 3 (three) times daily as needed for muscle spasms.   docusate sodium 100 MG capsule Commonly known as: COLACE Take 1 capsule (100 mg total) by mouth 2 (two) times daily.   DULoxetine 60 MG capsule Commonly known as: CYMBALTA Take 60 mg by mouth daily.   HYDROmorphone 4 MG tablet Commonly known as: DILAUDID Take 1 tablet (4 mg total)  by mouth every 4 (four) hours as needed for severe pain. What changed:   medication strength  how much to take   indomethacin 25 MG capsule Commonly known as: INDOCIN Take 25 mg by mouth 2 (two) times daily as needed for moderate pain.   montelukast 10 MG tablet Commonly known as: SINGULAIR Take 10 mg by mouth at bedtime.   ProAir HFA 108 (90 Base) MCG/ACT inhaler Generic drug: albuterol Inhale 1-2 puffs into the lungs every 6 (six) hours as needed for shortness of breath.   pyridOXINE 100 MG tablet Commonly known as: VITAMIN B-6 Take 100 mg by mouth daily.        Signed: Ophelia Charter 05/13/2019, 8:05 AM

## 2019-05-13 NOTE — Evaluation (Signed)
Physical Therapy Evaluation Patient Details Name: [name] MRN: [id number] DOB: 11-16-1955 Today's Date: 05/13/2019   History of Present Illness  Pt is a 63 yo male s/p T10-11 laminectomy due to numbness and pain in back and BLEs. Pt PMHx: Lumbar fusion surgery, arthritis, back pain, HTN.  Clinical Impression  Patient is s/p above surgery resulting in the deficits listed below (see PT Problem List).  Patient plans to DC home today and does not currently want f/u therapy. I have encouraged the patient to gradually increase activity daily to tolerance.  I have answered all patient's question regarding PT and mobility.   Pt feels ready and safe for DC home today.     Follow Up Recommendations No PT follow up;Supervision - Intermittent(Pt does not want f/u PT)    Equipment Recommendations    None, has RW   Recommendations for Other Services       Precautions / Restrictions Precautions Precautions: Back;Fall Precaution Booklet Issued: Yes (comment) Precaution Comments: (Discussed precautions and practical applications. ) Required Braces or Orthoses: (No order for brace) Restrictions Weight Bearing Restrictions: No      Mobility  Bed Mobility Overal bed mobility: (NT-pt sitting up and did not want to lay down or practice ) Bed Mobility: Rolling;Sidelying to Sit Rolling: Supervision Sidelying to sit: Supervision       General bed mobility comments: + use of rail slightly. pt advised not to use as pt does not have rail at home. Pt reports nearly falling off of bed and stopping self with legs on wall.. pt advised to perform log roll and avoid jostling back with any momentous movements as described.  Transfers Overall transfer level: Needs assistance Equipment used: Rolling walker (2 wheeled) Transfers: Sit to/from Stand Sit to Stand: Supervision         General transfer comment: Used safe technique with transfers  Ambulation/Gait Ambulation/Gait assistance:  Supervision Gait Distance (Feet): 125 Feet Assistive device: Rolling walker (2 wheeled) Gait Pattern/deviations: Step-through pattern;Decreased stride length;Decreased dorsiflexion - left(increased hip flexion on LLE)     General Gait Details: Utilizes RW for weightbearing. Took multiple standing rest breaks.   Stairs Stairs: (Verbally discussed stairs but pt did not want to practice)          Wheelchair Mobility    Modified Rankin (Stroke Patients Only)       Balance                                             Pertinent Vitals/Pain Pain Assessment: 0-10 Pain Score: 4  Faces Pain Scale: Hurts little more Pain Location: low/mid back Pain Descriptors / Indicators: Discomfort Pain Intervention(s): Premedicated before session;Limited activity within patient's tolerance    Home Living Family/patient expects to be discharged to:: Private residence Living Arrangements: Alone Available Help at Discharge: Family;Available PRN/intermittently Type of Home: House Home Access: Stairs to enter Entrance Stairs-Rails: (Reports he uses the walker on the stairs) Entrance Stairs-Number of Steps: 5 Home Layout: One level Home Equipment: Walker - 2 wheels Additional Comments: family/friends live an hour away    Prior Function Level of Independence: Independent with assistive device(s)         Comments: Using RW PTA     Hand Dominance   Dominant Hand: Right    Extremity/Trunk Assessment   Upper Extremity Assessment Upper Extremity Assessment: Defer to OT evaluation LUE  Deficits / Details: decreased ROM at shoulder    Lower Extremity Assessment Lower Extremity Assessment: LLE deficits/detail LLE Deficits / Details: functional weakness in LLE LLE Coordination: decreased gross motor    Cervical / Trunk Assessment Cervical / Trunk Assessment: Kyphotic  Communication   Communication: No difficulties  Cognition Arousal/Alertness:  Awake/alert Behavior During Therapy: WFL for tasks assessed/performed Overall Cognitive Status: Within Functional Limits for tasks assessed                                 General Comments: Pt polite, but often said "I like to do things my certain way. I know that you have to teach me this stuff though." Education for precautions required and pt listened/followed directions farily well      General Comments General comments (skin integrity, edema, etc.): Pt reports this is his 3rd back surgery.    Exercises     Assessment/Plan    PT Assessment Patent does not need any further PT services(Pt does not want f/u PT due to COVID-19 and previous PT )  PT Problem List         PT Treatment Interventions      PT Goals (Current goals can be found in the Care Plan section)  Acute Rehab PT Goals Patient Stated Goal: to go home soon    Frequency     Barriers to discharge        Co-evaluation               AM-PAC PT "6 Clicks" Mobility  Outcome Measure Help needed turning from your back to your side while in a flat bed without using bedrails?: None Help needed moving from lying on your back to sitting on the side of a flat bed without using bedrails?: None Help needed moving to and from a bed to a chair (including a wheelchair)?: None Help needed standing up from a chair using your arms (e.g., wheelchair or bedside chair)?: None Help needed to walk in hospital room?: A Little Help needed climbing 3-5 steps with a railing? : A Little 6 Click Score: 22    End of Session   Activity Tolerance: Patient tolerated treatment well Patient left: in bed;with call bell/phone within reach Nurse Communication: Mobility status PT Visit Diagnosis: Unsteadiness on feet (R26.81)    Time: [id number] PT Time Calculation (min) (ACUTE ONLY): 28 min   Charges:   PT Evaluation $PT Eval Low Complexity: 1 Low PT Treatments $Gait Training: 8-22 mins          Donnella ShamSawulski,  [name] 05/13/2019, 10:43 AM  Christopher Hester, PT   Acute Rehabilitation Services  Pager [phone] Office [phone] 05/13/2019

## 2019-05-13 NOTE — Evaluation (Signed)
Occupational Therapy Evaluation Patient Details Name: Christopher Hester C Soth MRN: [id number] DOB: [birth year] Today's Date: 05/13/2019    History of Present Illness Pt is a 63 yo male s/p T10-11 laminectomy due to numbness and pain in back and BLEs. Pt PMHx: Lumbar fusion surgery, arthritis, back pain, HTN.   Clinical Impression   Pt PTA; living alone and independent. Pt currently requiring verbal cues for proper back precautions. Pt left back brace at home, but attempted to make sure donning/doffing was easy. Pt requiring education for LB dressing and pt denied need for AE education, but fond of reacher for LB dressing once saw the use and pt deciding between 3in1 and tub transfer bench. Pt shown photos and simulating tub transfer with minA. Overall, pt minguardA for mobility with RW and set-upA for LB ADL with figure 4 technique- cues for extending back too far. Pt stood for grooming at sink x5 mins and leaned on wall for support and required cues to reduce leaning on counter as pt was bending. Pt able to stand upright  ,2 mins and requires a chair close by as pt unsteady.  Pt polite, but often said "I like to do things my certain way. I know that you have to teach me this stuff though." Education for precautions required and pt listened/followed directions farily well. Pt would benefit from continued OT skilled services for ADL, mobility and safety in HHOT vs OP OT setting. Pt lives alone and stating his closest family/friends are an hour away. OT following acutely.     Follow Up Recommendations  Home health OT;Outpatient OT;Supervision - Intermittent(pt would REALLY benefit from follow-up therapy.)    Equipment Recommendations  3 in 1 bedside commode;Tub/shower bench    Recommendations for Other Services       Precautions / Restrictions Precautions Precautions: Back;Fall Precaution Booklet Issued: Yes (comment) Precaution Comments: verbally discussed precautions Required Braces or Orthoses:  Spinal Brace(left brace at home.) Restrictions Weight Bearing Restrictions: No      Mobility Bed Mobility Overal bed mobility: Needs Assistance Bed Mobility: Rolling;Sidelying to Sit Rolling: Supervision Sidelying to sit: Supervision       General bed mobility comments: + use of rail slightly. pt advised not to use as pt does not have rail at home. Pt reports nearly falling off of bed and stopping self with legs on wall.. pt advised to perform log roll and avoid jostling back with any momentous movements as described.  Transfers Overall transfer level: Needs assistance Equipment used: Rolling walker (2 wheeled) Transfers: Sit to/from Stand Sit to Stand: Supervision              Balance                                           ADL either performed or assessed with clinical judgement   ADL Overall ADL's : Needs assistance/impaired Eating/Feeding: Modified independent;Sitting   Grooming: Wash/dry hands;Wash/dry face;Oral care;Min guard;Standing;Cueing for safety Grooming Details (indicate cue type and reason): Pt leaning against wall for stability and pt leaning on sink requiring cues to adhere to back precautions Upper Body Bathing: Set up;Sitting   Lower Body Bathing: Supervison/ safety;Set up;Sitting/lateral leans;Sit to/from stand;Cueing for back precautions Lower Body Bathing Details (indicate cue type and reason): seated performing figure 4 technique. pt verified that this was the safest way for LB bathing in seated position and purchasing a  transfer tub bench for safest way to use tub at home alone. Pt advised to have supervision for transfers. Upper Body Dressing : Set up;Sitting   Lower Body Dressing: Set up;Cueing for safety;Cueing for back precautions;Sitting/lateral leans;Sit to/from stand Lower Body Dressing Details (indicate cue type and reason): figure 4 technique- minimal strain on low back. pt advised to AE, but denied need for  demonstration.         Tub/ Shower Transfer: Minimal assistance;Rolling walker;Cueing for safety;Cueing for sequencing;Tub transfer Tub/Shower Transfer Details (indicate cue type and reason): reaching for wall for support as he would at home. pt advised to buy transfer tub bench Functional mobility during ADLs: Min guard;Rolling walker General ADL Comments: set-upA overall. MinA for tub transfers without a transfer tub bench.      Vision Baseline Vision/History: No visual deficits Vision Assessment?: No apparent visual deficits     Perception     Praxis      Pertinent Vitals/Pain Pain Assessment: Faces Faces Pain Scale: Hurts little more Pain Location: low back Pain Descriptors / Indicators: Discomfort;Throbbing Pain Intervention(s): Premedicated before session;Monitored during session;Limited activity within patient's tolerance;Repositioned     Hand Dominance Right   Extremity/Trunk Assessment Upper Extremity Assessment Upper Extremity Assessment: Overall WFL for tasks assessed;LUE deficits/detail LUE Deficits / Details: decreased ROM at shoulder   Lower Extremity Assessment Lower Extremity Assessment: Defer to PT evaluation;Generalized weakness;LLE deficits/detail LLE Deficits / Details: L knee flexion with mobility   Cervical / Trunk Assessment Cervical / Trunk Assessment: Kyphotic   Communication Communication Communication: No difficulties   Cognition Arousal/Alertness: Awake/alert Behavior During Therapy: Impulsive Overall Cognitive Status: Within Functional Limits for tasks assessed                                 General Comments: Pt polite, but often said "I like to do things my certain way. I know that you have to teach me this stuff though." Education for precautions required and pt listened/followed directions farily well   General Comments  Pt reporting "I've been through this before and I went to rehab for a month so I know what I am  doing," Pt advised to refer to education for precaution handout as OT has been going over throughout session.    Exercises     Shoulder Instructions      Home Living Family/patient expects to be discharged to:: Private residence Living Arrangements: Alone Available Help at Discharge: Family;Available PRN/intermittently Type of Home: House Home Access: Stairs to enter Enter[employer] CorporationEntrance Stairs-Number of Steps: 5 Entrance Stairs-Rails: Can reach both Home Layout: One level     Bathroom Shower/Tub: Tub/shower unit;Walk-in shower   Bathroom Toilet: Handicapped height Bathroom Accessibility: No   Home Equipment: Walker - 2 wheels   Additional Comments: family/friends live an hour away      Prior Functioning/Environment Level of Independence: Independent with assistive device(s)        Comments: required RW and very difficult to perform everyday tasks in pain. Was performing figure 4 technique for LB dressing.        OT Problem List: Decreased strength;Decreased activity tolerance;Impaired balance (sitting and/or standing);Decreased coordination;Pain      OT Treatment/Interventions: Self-care/ADL training;Therapeutic exercise;DME and/or AE instruction;Energy conservation;Therapeutic activities;Patient/family education;Balance training    OT Goals(Current goals can be found in the care plan section) Acute Rehab OT Goals Patient Stated Goal: to be safe at home OT Goal Formulation: With patient Time For  Goal Achievement: 05/27/19 Potential to Achieve Goals: Good ADL Goals Pt Will Perform Lower Body Dressing: with modified independence;sitting/lateral leans;sit to/from stand Pt Will Perform Toileting - Clothing Manipulation and hygiene: with modified independence;sitting/lateral leans;sit to/from stand;with adaptive equipment Additional ADL Goal #1: Pt Modified independence for OOB ADL with fair balance  OT Frequency: Min 2X/week   Barriers to D/C: Decreased caregiver  support  reports family/friends live 1 hour away       Co-evaluation              AM-PAC OT "6 Clicks" Daily Activity     Outcome Measure Help from another person eating meals?: None Help from another person taking care of personal grooming?: None Help from another person toileting, which includes using toliet, bedpan, or urinal?: A Little Help from another person bathing (including washing, rinsing, drying)?: A Little Help from another person to put on and taking off regular upper body clothing?: None Help from another person to put on and taking off regular lower body clothing?: None 6 Click Score: 22   End of Session Equipment Utilized During Treatment: Rolling walker Nurse Communication: Mobility status  Activity Tolerance: Patient tolerated treatment well Patient left: in bed;with call bell/phone within reach;Other (comment)(PT coming in.)  OT Visit Diagnosis: Unsteadiness on feet (R26.81);Muscle weakness (generalized) (M62.81);Pain Pain - Right/Left: Left Pain - part of body: (back)                Time: 6599-3570 OT Time Calculation (min): 41 min Charges:  OT General Charges $OT Visit: 1 Visit OT Evaluation $OT Eval Moderate Complexity: 1 Mod OT Treatments $Self Care/Home Management : 8-22 mins $Neuromuscular Re-education: 8-22 mins  Darryl Nestle) Marsa Aris OTR/L Acute Rehabilitation Services Pager: [phone] Office: [place] 05/13/2019, 9:52 AM

## 2019-05-13 NOTE — Anesthesia Postprocedure Evaluation (Signed)
Anesthesia Post Note  Patient: Christopher Hester  Procedure(s) Performed: LAMINECTOMY THORACIC 10- THORACIC 11 (N/A Spine Thoracic)     Patient location during evaluation: PACU Anesthesia Type: General Level of consciousness: awake and alert Pain management: pain level controlled Vital Signs Assessment: post-procedure vital signs reviewed and stable Respiratory status: spontaneous breathing, nonlabored ventilation, respiratory function stable and patient connected to nasal cannula oxygen Cardiovascular status: blood pressure returned to baseline and stable Postop Assessment: no apparent nausea or vomiting Anesthetic complications: no    Last Vitals:  Vitals:   05/13/19 0335 05/13/19 0719  BP: 123/72 106/65  Pulse: 65 68  Resp: 18 16  Temp: 36.6 C 36.5 C  SpO2: 97% 99%    Last Pain:  Vitals:   05/13/19 0719  TempSrc: Oral  PainSc:                  [name]

## 2019-05-13 NOTE — Plan of Care (Signed)
Patient alert and oriented, mae's well, voiding adequate amount of urine, swallowing without difficulty, no c/o pain at time of discharge. Patient discharged home with family. Script and discharged instructions given to patient. Patient and family stated understanding of instructions given. Patient has an appointment with Dr. Jenkins   

## 2020-12-12 DEATH — deceased
